# Patient Record
Sex: Female | Born: 1985 | Race: Black or African American | Hispanic: No | Marital: Single | Smoking: Never smoker
Health system: Southern US, Community
[De-identification: ages and names within clinical notes are randomized; demographics above are authoritative.]

## PROBLEM LIST (undated history)

## (undated) DIAGNOSIS — C801 Malignant (primary) neoplasm, unspecified: Secondary | ICD-10-CM

## (undated) DIAGNOSIS — E119 Type 2 diabetes mellitus without complications: Secondary | ICD-10-CM

## (undated) HISTORY — PX: BREAST REDUCTION SURGERY: SHX8

---
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
## 2006-06-01 ENCOUNTER — Emergency Department (HOSPITAL_COMMUNITY): Admission: EM | Admit: 2006-06-01 | Discharge: 2006-06-01 | Payer: Self-pay | Admitting: Emergency Medicine

## 2010-08-04 ENCOUNTER — Emergency Department (HOSPITAL_COMMUNITY)
Admission: EM | Admit: 2010-08-04 | Discharge: 2010-08-04 | Disposition: A | Discharge status: Home / Self Care | Payer: Medicaid Other | Attending: Emergency Medicine | Admitting: Emergency Medicine

## 2010-08-04 DIAGNOSIS — N898 Other specified noninflammatory disorders of vagina: Secondary | ICD-10-CM | POA: Insufficient documentation

## 2010-08-04 DIAGNOSIS — N72 Inflammatory disease of cervix uteri: Secondary | ICD-10-CM | POA: Insufficient documentation

## 2010-08-04 LAB — WET PREP, GENITAL
Trich, Wet Prep: NONE SEEN
Yeast Wet Prep HPF POC: NONE SEEN

## 2010-08-04 LAB — URINALYSIS, ROUTINE W REFLEX MICROSCOPIC
Glucose, UA: 100 mg/dL — AB
Ketones, ur: NEGATIVE mg/dL
Nitrite: NEGATIVE
Protein, ur: 30 mg/dL — AB
Specific Gravity, Urine: >1.03 — ABNORMAL HIGH (ref 1.005–1.030)
Urobilinogen, UA: 1 mg/dL (ref 0.0–1.0)
pH: 6 (ref 5.0–8.0)

## 2010-08-04 LAB — URINE MICROSCOPIC-ADD ON

## 2010-08-04 MED ORDER — CEFTRIAXONE SODIUM 250 MG IJ SOLR
250.0000 mg | Freq: Once | INTRAMUSCULAR | Status: AC
  Administered 2010-08-04: 250 mg via INTRAMUSCULAR
  Filled 2010-08-04: qty 250

## 2010-08-04 MED ORDER — DOXYCYCLINE HYCLATE 100 MG PO CAPS: 100.0000 mg | ORAL_CAPSULE | Freq: Two times a day (BID) | ORAL | Status: AC

## 2010-08-04 NOTE — ED Notes (Signed)
Patient with no complaints at this time. Respirations even and unlabored. Skin warm/dry. Discharge instructions reviewed with patient at this time. Patient given opportunity to voice concerns/ask questions. Patient discharged at this time and left Emergency Department with steady gait.   

## 2010-08-04 NOTE — ED Provider Notes (Signed)
History     Chief Complaint  Patient presents with  . Vaginal Bleeding   Patient is a 25 y.o. female presenting with vaginal bleeding. The history is provided by the patient. No language interpreter was used.  Vaginal Bleeding This is a new problem. The current episode started 12 to 24 hours ago. Episode frequency: spotting noted only with wiping with toilet tissue.  The problem has not changed since onset.Pertinent negatives include no chest pain and no abdominal pain. Associated symptoms comments: No associated vaginal fluid leakage, dysuria, vaginal discharge, fever/chills, N/V. She has had right low back pain radiating to the right upper leg that started this morning and is similar to pain present in her second pregnancy in the 3rd trimester.. The symptoms are aggravated by nothing. The symptoms are relieved by nothing. She has tried nothing for the symptoms.    Past Medical History  Diagnosis Date  . Asthma     Past Surgical History  Procedure Date  . Cesarean section     History reviewed. No pertinent family history.  History  Substance Use Topics  . Smoking status: Never Smoker   . Smokeless tobacco: Not on file  . Alcohol Use: No    OB History    Grav Para Term Preterm Abortions TAB SAB Ect Mult Living   3 1   1  1          Review of Systems  Cardiovascular: Negative for chest pain.  Gastrointestinal: Negative for abdominal pain.  Genitourinary: Positive for vaginal bleeding.  All other systems reviewed and are negative.  With the exceptions noted in the HPI  Physical Exam  BP 128/65  Pulse 108  Temp(Src) 98.2 F (36.8 C) (Oral)  Resp 18  Ht 5\' 2"  (1.575 m)  Wt 177 lb (80.287 kg)  BMI 32.37 kg/m2  SpO2 100%  LMP 04/29/2010  Physical Exam  Constitutional: She is oriented to person, place, and time. She appears well-developed and well-nourished.       Vitals normal except typical pregnancy related tachycardia.  HENT:  Head: Normocephalic and  atraumatic.  Right Ear: Hearing normal.  Left Ear: Tympanic membrane normal.  Mouth/Throat: Uvula is midline, oropharynx is clear and moist and mucous membranes are normal.  Eyes: Conjunctivae, EOM and lids are normal. Pupils are equal, round, and reactive to light.  Neck: Trachea normal and phonation normal. No mass and no thyromegaly present.  Cardiovascular: Regular rhythm, S1 normal and S2 normal.   No extrasystoles are present. Tachycardia present.   Pulmonary/Chest: Effort normal and breath sounds normal. She exhibits no tenderness.  Abdominal: Soft. Bowel sounds are normal. There is no hepatosplenomegaly. There is no tenderness. There is no CVA tenderness. No hernia. Hernia confirmed negative in the right inguinal area and confirmed negative in the left inguinal area.    Genitourinary: Pelvic exam was performed with patient supine. There is no rash on the right labia. There is no rash on the left labia. There is bleeding around the vagina. Vaginal discharge found.       Bleeding is scant with a single small clot. There is a mucoid, greenish d/c per the cervical os. There is no cervical motion tenderness, adnexal or uterine tenderness or adnexal mass on bimanual palpation.  Musculoskeletal: Normal range of motion.       Cervical back: Normal.       Thoracic back: Normal.       Lumbar back: Normal.       No paraspinal tenderness  Neurological: She is alert and oriented to person, place, and time. She has normal strength. No cranial nerve deficit or sensory deficit.  Skin: Skin is warm, dry and intact.  Psychiatric: She has a normal mood and affect. Her speech is normal and behavior is normal. Judgment and thought content normal. Cognition and memory are normal.    ED Course  Procedures  MDM Initial impression is cervicitis; doubt pre-term labor, placenta previa or abruption      Flint Melter, MD 08/04/10 1301

## 2010-08-04 NOTE — ED Notes (Signed)
Pt presents with spotting, back pain, and right leg numbness. Pt [redacted] wks pregnant. Pt seen by Dr Shelva Majestic for [place]. LMP was 04/29/2010. [facility] 01/19/2011

## 2010-08-04 NOTE — ED Notes (Signed)
Lab notified of urine culture 

## 2010-08-05 LAB — URINE CULTURE
Colony Count: 60000
Culture  Setup Time: 201207152052

## 2010-08-06 LAB — GC/CHLAMYDIA PROBE AMP, GENITAL
Chlamydia, DNA Probe: NEGATIVE
GC Probe Amp, Genital: NEGATIVE

## 2011-08-09 ENCOUNTER — Emergency Department (HOSPITAL_COMMUNITY)
Admission: EM | Admit: 2011-08-09 | Discharge: 2011-08-09 | Disposition: A | Discharge status: Home / Self Care | Payer: Self-pay | Attending: Emergency Medicine | Admitting: Emergency Medicine

## 2011-08-09 ENCOUNTER — Encounter (HOSPITAL_COMMUNITY): Payer: Self-pay | Admitting: *Deleted

## 2011-08-09 DIAGNOSIS — K029 Dental caries, unspecified: Secondary | ICD-10-CM | POA: Insufficient documentation

## 2011-08-09 DIAGNOSIS — K0889 Other specified disorders of teeth and supporting structures: Secondary | ICD-10-CM

## 2011-08-09 MED ORDER — PENICILLIN V POTASSIUM 250 MG PO TABS: 250.0000 mg | ORAL_TABLET | Freq: Four times a day (QID) | ORAL | Status: AC

## 2011-08-09 MED ORDER — HYDROCODONE-ACETAMINOPHEN 5-325 MG PO TABS: ORAL_TABLET | ORAL | Status: AC

## 2011-08-09 MED ORDER — NAPROXEN 250 MG PO TABS: 250.0000 mg | ORAL_TABLET | Freq: Two times a day (BID) | ORAL | Status: AC

## 2011-08-09 NOTE — ED Notes (Signed)
Pt c/o left toothache, earache and headache x 2 weeks.

## 2011-08-09 NOTE — ED Provider Notes (Signed)
History     CSN: [id number]  Arrival date & time 08/09/11  1114   First MD Initiated Contact with Patient 08/09/11 1120      Chief Complaint  Patient presents with  . Dental Pain    HPI Pt was seen at 1120.  Per pt, c/o gradual onset and persistence of constant left upper teeth "pain" for the past 2 weeks.  States the pain has now caused her left ear and left side of her head to "hurt."  Denies fevers, no intra-oral edema, no rash, no facial swelling, no dysphagia, no neck pain.   The condition is aggravated by nothing. The condition is relieved by nothing. The patient has no significant history of serious medical conditions.    Pt on depo shot Past Medical History  Diagnosis Date  . Asthma     Past Surgical History  Procedure Date  . Cesarean section    History  Substance Use Topics  . Smoking status: Never Smoker   . Smokeless tobacco: Not on file  . Alcohol Use: No    OB History    Grav Para Term Preterm Abortions TAB SAB Ect Mult Living   3 1   1  1          Review of Systems ROS: Statement: All systems negative except as marked or noted in the HPI; Constitutional: Negative for fever and chills. ; ; Eyes: Negative for eye pain and discharge. ; ; ENMT: Positive for dental caries, dental hygiene poor and toothache. Negative for bleeding gums, dental injury, facial deformity, facial swelling, hoarseness, nasal congestion, sinus pressure, sore throat, throat swelling and tongue swollen. ; ; Cardiovascular: Negative for chest pain, palpitations, diaphoresis, dyspnea and peripheral edema. ; ; Respiratory: Negative for cough, wheezing and stridor. ; ; Gastrointestinal: Negative for nausea, vomiting, diarrhea and abdominal pain. ; ; Genitourinary: Negative for dysuria, flank pain and hematuria. ; ; Musculoskeletal: Negative for back pain and neck pain. ; ; Skin: Negative for rash and skin lesion. ; ; Neuro: Negative for headache, lightheadedness and neck stiffness. ;     Allergies  Review of patient's allergies indicates no known allergies.  Home Medications   Current Outpatient Rx  Name Route Sig Dispense Refill  . FERROUS SULFATE 325 (65 FE) MG PO TABS Oral Take 325 mg by mouth daily with breakfast.      . HYDROCODONE-ACETAMINOPHEN 5-325 MG PO TABS  1 or 2 tabs PO q4-6 hours prn pain 20 tablet 0  . NAPROXEN 250 MG PO TABS Oral Take 1 tablet (250 mg total) by mouth 2 (two) times daily with a meal. 14 tablet 0  . PENICILLIN V POTASSIUM 250 MG PO TABS Oral Take 1 tablet (250 mg total) by mouth 4 (four) times daily. 20 tablet 0  . PRENATAL RX 60-1 MG PO TABS Oral Take 1 tablet by mouth daily.        BP 135/80  Pulse 92  Temp 98.4 F (36.9 C) (Oral)  Resp 16  Ht 5\' 2"  (1.575 m)  Wt 175 lb (79.379 kg)  BMI 32.01 kg/m2  SpO2 100%  LMP 04/29/2010  Breastfeeding? Unknown  Physical Exam 1125: Physical examination: Vital signs and O2 SAT: Reviewed; Constitutional: Well developed, Well nourished, Well hydrated, In no acute distress; Head and Face: Normocephalic, Atraumatic; Eyes: EOMI, PERRL, No scleral icterus; ENMT: Mouth and pharynx normal, Poor dentition, Widespread dental decay, Left TM normal, Right TM normal, Mucous membranes moist, +upper left 1st and 2nd premolars with  extensive dental decay.  No gingival erythema, edema, fluctuance, or drainage.  No hoarse voice, no drooling, no stridor.  ; Neck: Supple, Full range of motion, No lymphadenopathy; Cardiovascular: Regular rate and rhythm, No murmur, rub, or gallop; Respiratory: Breath sounds clear & equal bilaterally, No rales, rhonchi, wheezes, or rub, Normal respiratory effort/excursion; Chest: Nontender, Movement normal; Extremities: Pulses normal, No tenderness, No edema; Neuro: AA&Ox3, Major CN grossly intact.  No gross focal motor or sensory deficits in extremities.; Skin: Color normal, No rash, No petechiae, Warm, Dry   ED Course  Procedures     MDM  MDM Reviewed: nursing note and  vitals     11:26 AM:  Pt encouraged to f/u with dentist or oral surgeon for her dental needs for good continuity of care and definitive treatment.  Verb understanding.          Laray Anger, DO 08/11/11 1630

## 2012-10-07 ENCOUNTER — Other Ambulatory Visit: Payer: Self-pay

## 2012-10-26 ENCOUNTER — Ambulatory Visit (HOSPITAL_COMMUNITY)
Admission: RE | Admit: 2012-10-26 | Discharge: 2012-10-26 | Disposition: A | Discharge status: Home / Self Care | Payer: Medicaid Other | Source: Ambulatory Visit | Attending: Obstetrics and Gynecology | Admitting: Obstetrics and Gynecology

## 2012-10-26 ENCOUNTER — Other Ambulatory Visit (HOSPITAL_COMMUNITY): Payer: Self-pay | Admitting: Obstetrics and Gynecology

## 2012-10-26 ENCOUNTER — Encounter (HOSPITAL_COMMUNITY): Payer: Self-pay

## 2012-10-26 VITALS — BP 113/69 | HR 85 | Wt 186.5 lb

## 2012-10-26 DIAGNOSIS — O09299 Supervision of pregnancy with other poor reproductive or obstetric history, unspecified trimester: Secondary | ICD-10-CM

## 2012-10-26 DIAGNOSIS — O2620 Pregnancy care for patient with recurrent pregnancy loss, unspecified trimester: Secondary | ICD-10-CM | POA: Insufficient documentation

## 2012-10-26 DIAGNOSIS — O34219 Maternal care for unspecified type scar from previous cesarean delivery: Secondary | ICD-10-CM | POA: Insufficient documentation

## 2012-10-26 DIAGNOSIS — O2622 Pregnancy care for patient with recurrent pregnancy loss, second trimester: Secondary | ICD-10-CM

## 2012-10-26 DIAGNOSIS — O3430 Maternal care for cervical incompetence, unspecified trimester: Secondary | ICD-10-CM | POA: Insufficient documentation

## 2012-10-26 MED ORDER — HYDROXYPROGESTERONE CAPROATE 250 MG/ML IM OIL
250.0000 mg | TOPICAL_OIL | Freq: Once | INTRAMUSCULAR | Status: AC
  Administered 2012-10-26: 250 mg via INTRAMUSCULAR
  Filled 2012-10-26: qty 1

## 2012-10-27 LAB — LUPUS ANTICOAGULANT PANEL
DRVVT: 33.8 secs (ref <42.9)
Lupus Anticoagulant: NOT DETECTED
PTT Lupus Anticoagulant: 32.2 secs (ref 28.0–43.0)

## 2012-10-27 LAB — BETA-2-GLYCOPROTEIN I ABS, IGG/M/A
Beta-2 Glyco I IgG: 1 G Units (ref <20)
Beta-2-Glycoprotein I IgA: 0 A Units (ref <20)
Beta-2-Glycoprotein I IgM: 0 M Units (ref <20)

## 2012-10-27 LAB — CARDIOLIPIN ANTIBODIES, IGM+IGG
Anticardiolipin IgG: 8 GPL U/mL — ABNORMAL LOW (ref <23)
Anticardiolipin IgM: 0 MPL U/mL — ABNORMAL LOW (ref <11)

## 2012-10-29 ENCOUNTER — Telehealth (HOSPITAL_COMMUNITY): Payer: Self-pay | Admitting: *Deleted

## 2012-10-29 NOTE — Telephone Encounter (Signed)
Called patient with results of lupus anticoag panel, anticardiolipin, and beta2 glycoprotein.  Notified these are WNL.  Pt verbalized understanding.  Reminded patient of returning for P17 injection next Tuesday.

## 2012-11-02 ENCOUNTER — Encounter (HOSPITAL_COMMUNITY): Payer: Medicaid Other

## 2012-11-02 ENCOUNTER — Telehealth (HOSPITAL_COMMUNITY): Payer: Self-pay | Admitting: *Deleted

## 2012-11-02 ENCOUNTER — Ambulatory Visit (HOSPITAL_COMMUNITY)
Admission: RE | Admit: 2012-11-02 | Discharge: 2012-11-02 | Disposition: A | Discharge status: Home / Self Care | Payer: Medicaid Other | Source: Ambulatory Visit | Attending: Obstetrics and Gynecology | Admitting: Obstetrics and Gynecology

## 2012-11-02 MED ORDER — HYDROXYPROGESTERONE CAPROATE 250 MG/ML IM OIL
250.0000 mg | TOPICAL_OIL | Freq: Once | INTRAMUSCULAR | Status: DC
  Filled 2012-11-02: qty 1

## 2013-01-28 ENCOUNTER — Observation Stay: Payer: Self-pay | Admitting: Obstetrics and Gynecology

## 2013-01-28 LAB — URINALYSIS, COMPLETE
Bilirubin,UR: NEGATIVE
Blood: NEGATIVE
Glucose,UR: >=500 mg/dL (ref 0–75)
Ketone: NEGATIVE
Nitrite: NEGATIVE
Ph: 6 (ref 4.5–8.0)
Protein: NEGATIVE
RBC,UR: 4 /HPF (ref 0–5)
Specific Gravity: 1.017 (ref 1.003–1.030)
Squamous Epithelial: 12
WBC UR: 11 /HPF (ref 0–5)

## 2013-02-11 ENCOUNTER — Other Ambulatory Visit (HOSPITAL_COMMUNITY): Payer: Self-pay | Admitting: Obstetrics and Gynecology

## 2013-02-11 DIAGNOSIS — O2620 Pregnancy care for patient with recurrent pregnancy loss, unspecified trimester: Secondary | ICD-10-CM

## 2013-02-16 ENCOUNTER — Ambulatory Visit (HOSPITAL_COMMUNITY)
Admission: RE | Admit: 2013-02-16 | Discharge: 2013-02-16 | Disposition: A | Discharge status: Home / Self Care | Payer: Medicaid Other | Source: Ambulatory Visit | Attending: Obstetrics and Gynecology | Admitting: Obstetrics and Gynecology

## 2013-02-16 ENCOUNTER — Other Ambulatory Visit (HOSPITAL_COMMUNITY): Payer: Self-pay | Admitting: Obstetrics and Gynecology

## 2013-02-16 DIAGNOSIS — O2620 Pregnancy care for patient with recurrent pregnancy loss, unspecified trimester: Secondary | ICD-10-CM

## 2013-02-16 DIAGNOSIS — O34219 Maternal care for unspecified type scar from previous cesarean delivery: Secondary | ICD-10-CM | POA: Insufficient documentation

## 2013-07-12 ENCOUNTER — Encounter (HOSPITAL_COMMUNITY): Payer: Self-pay

## 2013-08-31 ENCOUNTER — Encounter (HOSPITAL_COMMUNITY): Payer: Self-pay | Admitting: *Deleted

## 2013-11-21 ENCOUNTER — Encounter (HOSPITAL_COMMUNITY): Payer: Self-pay | Admitting: *Deleted

## 2015-11-24 ENCOUNTER — Emergency Department
Admission: EM | Admit: 2015-11-24 | Discharge: 2015-11-24 | Disposition: A | Discharge status: Home / Self Care | Payer: Medicaid Other | Attending: Emergency Medicine | Admitting: Emergency Medicine

## 2015-11-24 ENCOUNTER — Encounter: Payer: Self-pay | Admitting: Emergency Medicine

## 2015-11-24 DIAGNOSIS — Z79899 Other long term (current) drug therapy: Secondary | ICD-10-CM | POA: Diagnosis not present

## 2015-11-24 DIAGNOSIS — N390 Urinary tract infection, site not specified: Secondary | ICD-10-CM

## 2015-11-24 DIAGNOSIS — R42 Dizziness and giddiness: Secondary | ICD-10-CM

## 2015-11-24 DIAGNOSIS — O99282 Endocrine, nutritional and metabolic diseases complicating pregnancy, second trimester: Secondary | ICD-10-CM | POA: Diagnosis not present

## 2015-11-24 DIAGNOSIS — O26892 Other specified pregnancy related conditions, second trimester: Secondary | ICD-10-CM | POA: Diagnosis present

## 2015-11-24 DIAGNOSIS — J45909 Unspecified asthma, uncomplicated: Secondary | ICD-10-CM | POA: Insufficient documentation

## 2015-11-24 DIAGNOSIS — E860 Dehydration: Secondary | ICD-10-CM | POA: Insufficient documentation

## 2015-11-24 DIAGNOSIS — Z3A17 17 weeks gestation of pregnancy: Secondary | ICD-10-CM | POA: Insufficient documentation

## 2015-11-24 DIAGNOSIS — O2342 Unspecified infection of urinary tract in pregnancy, second trimester: Secondary | ICD-10-CM | POA: Insufficient documentation

## 2015-11-24 LAB — CBC WITH DIFFERENTIAL/PLATELET
Basophils Absolute: 0 10*3/uL (ref 0–0.1)
Basophils Relative: 0 %
Eosinophils Absolute: 0.1 10*3/uL (ref 0–0.7)
Eosinophils Relative: 2 %
HCT: 35.9 % (ref 35.0–47.0)
Hemoglobin: 12.2 g/dL (ref 12.0–16.0)
Lymphocytes Relative: 26 %
Lymphs Abs: 2.3 10*3/uL (ref 1.0–3.6)
MCH: 31.6 pg (ref 26.0–34.0)
MCHC: 34 g/dL (ref 32.0–36.0)
MCV: 93 fL (ref 80.0–100.0)
Monocytes Absolute: 0.6 10*3/uL (ref 0.2–0.9)
Monocytes Relative: 7 %
Neutro Abs: 5.6 10*3/uL (ref 1.4–6.5)
Neutrophils Relative %: 65 %
Platelets: 279 10*3/uL (ref 150–440)
RBC: 3.86 MIL/uL (ref 3.80–5.20)
RDW: 14 % (ref 11.5–14.5)
WBC: 8.7 10*3/uL (ref 3.6–11.0)

## 2015-11-24 LAB — URINALYSIS COMPLETE WITH MICROSCOPIC (ARMC ONLY)
Bilirubin Urine: NEGATIVE
Glucose, UA: NEGATIVE mg/dL
Hgb urine dipstick: NEGATIVE
Ketones, ur: NEGATIVE mg/dL
Nitrite: NEGATIVE
Protein, ur: NEGATIVE mg/dL
Specific Gravity, Urine: 1.019 (ref 1.005–1.030)
pH: 6 (ref 5.0–8.0)

## 2015-11-24 LAB — BASIC METABOLIC PANEL
Anion gap: 7 (ref 5–15)
BUN: 6 mg/dL (ref 6–20)
CO2: 21 mmol/L — ABNORMAL LOW (ref 22–32)
Calcium: 8.8 mg/dL — ABNORMAL LOW (ref 8.9–10.3)
Chloride: 106 mmol/L (ref 101–111)
Creatinine, Ser: 0.46 mg/dL (ref 0.44–1.00)
GFR calc Af Amer: >60 mL/min (ref >60)
GFR calc non Af Amer: >60 mL/min (ref >60)
Glucose, Bld: 105 mg/dL — ABNORMAL HIGH (ref 65–99)
Potassium: 3.3 mmol/L — ABNORMAL LOW (ref 3.5–5.1)
Sodium: 134 mmol/L — ABNORMAL LOW (ref 135–145)

## 2015-11-24 LAB — HCG, QUANTITATIVE, PREGNANCY: hCG, Beta Chain, Quant, S: 40535 m[IU]/mL — ABNORMAL HIGH (ref <5)

## 2015-11-24 LAB — TROPONIN I: Troponin I: <0.03 ng/mL (ref <0.03)

## 2015-11-24 MED ORDER — NITROFURANTOIN MONOHYD MACRO 100 MG PO CAPS: 100.0000 mg | ORAL_CAPSULE | Freq: Two times a day (BID) | ORAL | 0 refills | Status: AC

## 2015-11-24 MED ORDER — SODIUM CHLORIDE 0.9 % IV BOLUS (SEPSIS)
500.0000 mL | Freq: Once | INTRAVENOUS | Status: AC
  Administered 2015-11-24: 500 mL via INTRAVENOUS

## 2015-11-24 NOTE — ED Notes (Signed)
Pt ambulated hallway w/o difficulty, denies dizziness. MD made aware.

## 2015-11-24 NOTE — ED Provider Notes (Signed)
[facility] Emergency Department Provider Note   ____________________________________________   I have reviewed the triage vital signs and the nursing notes.   HISTORY  Chief Complaint Dizziness   History limited by: Not Limited   HPI Amy Nelson is a 30 y.o. female at [redacted] weeks pregnant who presents to the emergency department today because of concern for an episode of fainting last week and dizziness today. 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 The patient states that the episode of syncope was very brief. She did not have any chest pain or palpitations with either episode. The patient denies any recent illness or fevers.   Past Medical History:  Diagnosis Date  . Asthma     Patient Active Problem List   Diagnosis Date Noted  . Pregnancy complicated by previous recurrent miscarriages 10/26/2012    Past Surgical History:  Procedure Laterality Date  . CESAREAN SECTION      Prior to Admission medications   Medication Sig Start Date End Date Taking? Authorizing Provider  ferrous sulfate 325 (65 FE) MG tablet Take 325 mg by mouth daily with breakfast.      Historical Provider, MD  Prenatal Vit-Fe Fumarate-FA (PRENATAL MULTIVITAMIN) 60-1 MG tablet Take 1 tablet by mouth daily.      Historical Provider, MD    Allergies Review of patient's allergies indicates no known allergies.  No family history on file.  Social History Social History  Substance Use Topics  . Smoking status: Never Smoker  . Smokeless tobacco: Not on file  . Alcohol use No    Review of Systems  Constitutional: Negative for fever. Cardiovascular: Negative for chest pain. Respiratory: Negative for shortness of breath. Gastrointestinal: Negative for abdominal pain, vomiting and diarrhea. Neurological: Negative for headaches, focal weakness or numbness.  10-point ROS otherwise  negative.  ____________________________________________   PHYSICAL EXAM:  VITAL SIGNS: ED Triage Vitals [11/24/15 0745]  Enc Vitals Group     BP 136/89     Pulse Rate 98     Resp 18     Temp 98.4 F (36.9 C)     Temp Source Oral     SpO2 99 %     Weight 184 lb (83.5 kg)     Height 5\' 2"  (1.575 m)   Constitutional: Alert and oriented. Well appearing and in no distress. Eyes: Conjunctivae are normal. Normal extraocular movements. ENT   Head: Normocephalic and atraumatic.   Nose: No congestion/rhinnorhea.   Mouth/Throat: Mucous membranes are moist.   Neck: No stridor. Hematological/Lymphatic/Immunilogical: No cervical lymphadenopathy. Cardiovascular: Normal rate, regular rhythm.  No murmurs, rubs, or gallops.  Respiratory: Normal respiratory effort without tachypnea nor retractions. Breath sounds are clear and equal bilaterally. No wheezes/rales/rhonchi. Gastrointestinal: Soft and nontender. No distention.  Genitourinary: Deferred Musculoskeletal: Normal range of motion in all extremities. No lower extremity edema. Neurologic:  Normal speech and language. No gross focal neurologic deficits are appreciated.  Skin:  Skin is warm, dry and intact. No rash noted. Psychiatric: Mood and affect are normal. Speech and behavior are normal. Patient exhibits appropriate insight and judgment.  ____________________________________________    LABS (pertinent positives/negatives)  Labs Reviewed  BASIC METABOLIC PANEL - Abnormal; Notable for the following:       Result Value   Sodium 134 (*)    Potassium 3.3 (*)    CO2 21 (*)    Glucose, Bld 105 (*)  Calcium 8.8 (*)    All other components within normal limits  URINALYSIS COMPLETEWITH MICROSCOPIC ([facility] ONLY) - Abnormal; Notable for the following:    Color, Urine YELLOW (*)    APPearance HAZY (*)    Leukocytes, UA 1+ (*)    Bacteria, UA RARE (*)    Squamous Epithelial / LPF 6-30 (*)    All other components within  normal limits  HCG, QUANTITATIVE, PREGNANCY - Abnormal; Notable for the following:    hCG, Beta Chain, Quant, S 40,535 (*)    All other components within normal limits  CBC WITH DIFFERENTIAL/PLATELET  TROPONIN I     ____________________________________________   EKG  I, Nance Pear, attending physician, personally viewed and interpreted this EKG  EKG Time: 0755 Rate: 84 Rhythm: normal sinus rhythm Axis: normal Intervals: qtc 445 QRS: narrow ST changes: no st elevation Impression: normal ekg  ____________________________________________    RADIOLOGY  None  ____________________________________________   PROCEDURES  Procedures  ____________________________________________   INITIAL IMPRESSION / ASSESSMENT AND PLAN / ED COURSE  Pertinent labs & imaging results that were available during my care of the patient were reviewed by me and considered in my medical decision making (see chart for details).  Patient's blood work without concerning anemia. Patients ua concerning for some WBCs and patient states she has been having some abdominal pain. At this point think likely today's episode was near syncopal likely secondary to pregnancy. No chest pain or SOB to suggest PE.   ____________________________________________   FINAL CLINICAL IMPRESSION(S) / ED DIAGNOSES  Final diagnoses:  Dizziness  Dehydration  Urinary tract infection without hematuria, site unspecified     Note: This dictation was prepared with Dragon dictation. Any transcriptional errors that result from this process are unintentional    Nance Pear, MD 11/24/15 1135

## 2015-11-24 NOTE — Discharge Instructions (Signed)
Please seek medical attention for any high fevers, chest pain, shortness of breath, change in behavior, persistent vomiting, bloody stool or any other new or concerning symptoms.  

## 2015-11-24 NOTE — ED Triage Notes (Signed)
Pt states that she is [redacted] weeks pregnant, states that earlier in the week she passed out after becoming dizzy while working, pt states that it happened again this am while working too. No distress noted at this time, pt states that she drove herself and is here alone. Pt states "this dizzy spell happened at 0600 today"

## 2018-02-03 DIAGNOSIS — M5440 Lumbago with sciatica, unspecified side: Secondary | ICD-10-CM | POA: Diagnosis not present

## 2018-02-03 DIAGNOSIS — G8929 Other chronic pain: Secondary | ICD-10-CM | POA: Diagnosis not present

## 2018-02-03 DIAGNOSIS — N62 Hypertrophy of breast: Secondary | ICD-10-CM | POA: Diagnosis not present

## 2018-02-03 DIAGNOSIS — M542 Cervicalgia: Secondary | ICD-10-CM | POA: Diagnosis not present

## 2018-03-31 DIAGNOSIS — N62 Hypertrophy of breast: Secondary | ICD-10-CM | POA: Diagnosis not present

## 2018-05-26 DIAGNOSIS — Z01818 Encounter for other preprocedural examination: Secondary | ICD-10-CM | POA: Diagnosis not present

## 2018-05-26 DIAGNOSIS — N62 Hypertrophy of breast: Secondary | ICD-10-CM | POA: Diagnosis not present

## 2018-05-26 DIAGNOSIS — Z1159 Encounter for screening for other viral diseases: Secondary | ICD-10-CM | POA: Diagnosis not present

## 2018-05-28 DIAGNOSIS — M5442 Lumbago with sciatica, left side: Secondary | ICD-10-CM | POA: Diagnosis not present

## 2018-05-28 DIAGNOSIS — M542 Cervicalgia: Secondary | ICD-10-CM | POA: Diagnosis not present

## 2018-05-28 DIAGNOSIS — M5441 Lumbago with sciatica, right side: Secondary | ICD-10-CM | POA: Diagnosis not present

## 2018-05-28 DIAGNOSIS — G8929 Other chronic pain: Secondary | ICD-10-CM | POA: Diagnosis not present

## 2018-05-28 DIAGNOSIS — M5440 Lumbago with sciatica, unspecified side: Secondary | ICD-10-CM | POA: Diagnosis not present

## 2018-05-28 DIAGNOSIS — G8918 Other acute postprocedural pain: Secondary | ICD-10-CM | POA: Diagnosis not present

## 2018-05-28 DIAGNOSIS — N62 Hypertrophy of breast: Secondary | ICD-10-CM | POA: Diagnosis not present

## 2018-05-29 DIAGNOSIS — G8929 Other chronic pain: Secondary | ICD-10-CM | POA: Diagnosis not present

## 2018-05-29 DIAGNOSIS — M5441 Lumbago with sciatica, right side: Secondary | ICD-10-CM | POA: Diagnosis not present

## 2018-05-29 DIAGNOSIS — M5442 Lumbago with sciatica, left side: Secondary | ICD-10-CM | POA: Diagnosis not present

## 2018-05-29 DIAGNOSIS — M542 Cervicalgia: Secondary | ICD-10-CM | POA: Diagnosis not present

## 2018-05-29 DIAGNOSIS — N62 Hypertrophy of breast: Secondary | ICD-10-CM | POA: Diagnosis not present

## 2018-06-02 DIAGNOSIS — N62 Hypertrophy of breast: Secondary | ICD-10-CM | POA: Diagnosis not present

## 2018-06-09 DIAGNOSIS — N62 Hypertrophy of breast: Secondary | ICD-10-CM | POA: Diagnosis not present

## 2018-08-09 LAB — VITAMIN D 25 HYDROXY (VIT D DEFICIENCY, FRACTURES): Vit D, 25-Hydroxy: 25

## 2018-08-09 LAB — TSH: TSH: 2.27 (ref 0.41–5.90)

## 2018-08-09 LAB — HEMOGLOBIN A1C: Hemoglobin A1C: 5.2

## 2018-09-28 DIAGNOSIS — Z3482 Encounter for supervision of other normal pregnancy, second trimester: Secondary | ICD-10-CM | POA: Diagnosis not present

## 2018-09-29 ENCOUNTER — Other Ambulatory Visit: Payer: Self-pay

## 2018-09-29 ENCOUNTER — Ambulatory Visit (INDEPENDENT_AMBULATORY_CARE_PROVIDER_SITE_OTHER): Payer: BC Managed Care – PPO | Admitting: "Endocrinology

## 2018-09-29 ENCOUNTER — Encounter: Payer: Self-pay | Admitting: "Endocrinology

## 2018-09-29 VITALS — BP 117/83 | HR 120 | Ht 62.0 in | Wt 188.0 lb

## 2018-09-29 DIAGNOSIS — E049 Nontoxic goiter, unspecified: Secondary | ICD-10-CM | POA: Diagnosis not present

## 2018-09-29 NOTE — Progress Notes (Signed)
Endocrinology Consult Note                                            09/29/2018, 5:20 PM   Subjective:    Patient ID: Amy Nelson, female    DOB: Feb 10, 1985, PCP [facility]   Past Medical History:  Diagnosis Date  . Asthma    Past Surgical History:  Procedure Laterality Date  . BREAST REDUCTION SURGERY    . CESAREAN SECTION     Social History   Socioeconomic History  . Marital status: Single    Spouse name: Not on file  . Number of children: Not on file  . Years of education: Not on file  . Highest education level: Not on file  Occupational History  . Not on file  Social Needs  . Financial resource strain: Not on file  . Food insecurity    Worry: Not on file    Inability: Not on file  . Transportation needs    Medical: Not on file    Non-medical: Not on file  Tobacco Use  . Smoking status: Never Smoker  . Smokeless tobacco: Never Used  Substance and Sexual Activity  . Alcohol use: No  . Drug use: No  . Sexual activity: Yes    Birth control/protection: None  Lifestyle  . Physical activity    Days per week: Not on file    Minutes per session: Not on file  . Stress: Not on file  Relationships  . Social Herbalist on phone: Not on file    Gets together: Not on file    Attends religious service: Not on file    Active member of club or organization: Not on file    Attends meetings of clubs or organizations: Not on file    Relationship status: Not on file  Other Topics Concern  . Not on file  Social History Narrative  . Not on file   Family History  Problem Relation Age of Onset  . Diabetes Mother   . Hypertension Mother   . Hypertension Father   . Hyperlipidemia Father    Outpatient Encounter Medications as of 09/29/2018  Medication Sig  . Prenatal Vit-Fe Fumarate-FA (PRENATAL MULTIVITAMIN) 60-1 MG tablet Take 1 tablet by mouth daily.     No facility-administered encounter medications on file as  of 09/29/2018.    ALLERGIES: No Known Allergies  VACCINATION STATUS:  There is no immunization history on file for this patient.  HPI Amy Nelson is 33 y.o. female who presents today with a medical history as above. she is being seen in consultation for nodular goiter requested by Health, [facility]. She has noticed goiter for several years, recently increasing in size.  She had recent normal thyroid function test.  Her ultrasound on September 10, 2018 showed 7.7 cm nodule on the left lobe which measures 8 point is admitted reportedly increasing in size compared to another study in February 2018.  -She is currently pregnant at [redacted] weeks gestation.  She denies dysphagia, shortness of breath, nor voice change.  She has gained weight, appropriate to her pregnancy. She denies any family history of thyroid malignancy, however her mother has hypothyroidism on thyroid hormone supplement. She denies exposure to neck radiation.  She is not currently on  thyroid hormone supplement nor antithyroid medications.     Review of Systems  Constitutional: + Pregnancy related weight gain, no fatigue, no subjective hyperthermia, no subjective hypothermia Eyes: no blurry vision, no xerophthalmia ENT: no sore throat, + goiter, no dysphagia/odynophagia, no hoarseness Cardiovascular: no Chest Pain, no Shortness of Breath, no palpitations, no leg swelling Respiratory: no cough, no shortness of breath Gastrointestinal: no Nausea/Vomiting/Diarhhea Musculoskeletal: no muscle/joint aches Skin: no rashes Neurological: no tremors, no numbness, no tingling, no dizziness Psychiatric: no depression, no anxiety  Objective:    BP 117/83   Pulse (!) 120   Ht 5\' 2"  (1.575 m)   Wt 188 lb (85.3 kg)   BMI 34.39 kg/m   Wt Readings from Last 3 Encounters:  09/29/18 188 lb (85.3 kg)  11/24/15 184 lb (83.5 kg)  02/16/13 192 lb (87.1 kg)    Physical Exam  Constitutional:  Body mass index is  34.39 kg/m.,  not in acute distress, normal state of mind Eyes: PERRLA, EOMI, no exophthalmos ENT: moist mucous membranes, + large goiter asymmetric L>R, no gross cervical lymphadenopathy Cardiovascular: normal precordial activity, Regular Rate and Rhythm, no Murmur/Rubs/Gallops Respiratory:  adequate breathing efforts, no gross chest deformity, Clear to auscultation bilaterally Gastrointestinal: abdomen soft, Non -tender, No distension, Bowel Sounds present, no gross organomegaly Musculoskeletal: no gross deformities, strength intact in all four extremities Skin: moist, warm, no rashes Neurological: no tremor with outstretched hands, Deep tendon reflexes normal in bilateral lower extremities.  CMP ( most recent) CMP     Component Value Date/Time   NA 134 (L) 11/24/2015 0806   K 3.3 (L) 11/24/2015 0806   CL 106 11/24/2015 0806   CO2 21 (L) 11/24/2015 0806   GLUCOSE 105 (H) 11/24/2015 0806   BUN 6 11/24/2015 0806   CREATININE 0.46 11/24/2015 0806   CALCIUM 8.8 (L) 11/24/2015 0806   GFRNONAA >60 11/24/2015 0806   GFRAA >60 11/24/2015 0806     Summary of her thyroid ultrasound from September 10, 2018: Right lobe 5.1 cm x 1.2 cm x 1.3 cm-no nodules. Left lobe 8.5 mm x 5.3 cm x 4.4 cm with 7.7 cm nodule observed to increase in size compared to prior study from February 2018.  Recent Results (from the past 2160 hour(s))  VITAMIN D 25 Hydroxy (Vit-D Deficiency, Fractures)     Status: None   Collection Time: 08/09/18 12:00 AM  Result Value Ref Range   Vit D, 25-Hydroxy 25   Hemoglobin A1c     Status: None   Collection Time: 08/09/18 12:00 AM  Result Value Ref Range   Hemoglobin A1C 5.2   TSH     Status: None   Collection Time: 08/09/18 12:00 AM  Result Value Ref Range   TSH 2.27 0.41 - 5.90    Comment: Total T4 8.8, TPO antibodies < 9, tg abs <1     Assessment & Plan:   1. Nodular goiter 2.  Pregnant-16 weeks  - Timothy D Fulop  is being seen at a kind request of Health,  [place]. - I have reviewed her available thyroid records and clinically evaluated the patient. - Based on these reviews, she has euthyroid nodular goiter,  however,  there is not sufficient information to proceed with definitive treatment plan.  - she will need ultrasound-guided fine-needle aspiration at mid second trimester of her pregnancy (4 weeks from now) and will return for a visit.  If she is found to have suspicious or frank malignancy, she  will be considered for thyroidectomy during her second trimester.   In her FNA cytology is negative for malignancy, she will be observed with periodic thyroid ultrasound.  She does not have compressive symptoms at this time.   Based on her recent thyroid function test, she does not need intervention with thyroid hormone supplement nor antithyroid medication.  - I did not initiate any new prescriptions today. - I advised her  to maintain close follow up with [facility], [facility] for primary care needs.   - Time spent with the patient: 45 minutes, of which >50% was spent in obtaining information about her symptoms, reviewing her previous labs/studies,  evaluations, and treatments, counseling her about her nodular goiter, and developing a plan to confirm the diagnosis and long term treatment based on the latest standards of care/guidelines.    Amy Nelson participated in the discussions, expressed understanding, and voiced agreement with the above plans.  All questions were answered to her satisfaction. she is encouraged to contact clinic should she have any questions or concerns prior to her return visit.  Follow up plan: Return in about 6 weeks (around 11/10/2018) for Follow up with Biopsy Results.   Glade Lloyd, MD [facility] [address] [place], [place] [address] Phone: [phone]  Fax: [phone]     09/29/2018, 5:20 PM  This  note was partially dictated with voice recognition software. Similar sounding words can be transcribed inadequately or may not  be corrected upon review.

## 2018-10-12 DIAGNOSIS — O351XX0 Maternal care for (suspected) chromosomal abnormality in fetus, not applicable or unspecified: Secondary | ICD-10-CM | POA: Diagnosis not present

## 2018-10-12 DIAGNOSIS — Z3A17 17 weeks gestation of pregnancy: Secondary | ICD-10-CM | POA: Diagnosis not present

## 2018-10-12 DIAGNOSIS — Z3A18 18 weeks gestation of pregnancy: Secondary | ICD-10-CM | POA: Diagnosis not present

## 2018-10-12 DIAGNOSIS — Z315 Encounter for genetic counseling: Secondary | ICD-10-CM | POA: Diagnosis not present

## 2018-10-12 DIAGNOSIS — O288 Other abnormal findings on antenatal screening of mother: Secondary | ICD-10-CM | POA: Diagnosis not present

## 2018-10-12 DIAGNOSIS — Z3A00 Weeks of gestation of pregnancy not specified: Secondary | ICD-10-CM | POA: Diagnosis not present

## 2018-10-20 ENCOUNTER — Other Ambulatory Visit: Payer: Self-pay | Admitting: "Endocrinology

## 2018-11-01 DIAGNOSIS — E041 Nontoxic single thyroid nodule: Secondary | ICD-10-CM | POA: Diagnosis not present

## 2018-11-01 DIAGNOSIS — O34219 Maternal care for unspecified type scar from previous cesarean delivery: Secondary | ICD-10-CM | POA: Diagnosis not present

## 2018-11-10 ENCOUNTER — Encounter: Payer: Self-pay | Admitting: "Endocrinology

## 2018-11-10 ENCOUNTER — Other Ambulatory Visit: Payer: Self-pay

## 2018-11-10 ENCOUNTER — Ambulatory Visit (INDEPENDENT_AMBULATORY_CARE_PROVIDER_SITE_OTHER): Payer: BC Managed Care – PPO | Admitting: "Endocrinology

## 2018-11-10 DIAGNOSIS — R897 Abnormal histological findings in specimens from other organs, systems and tissues: Secondary | ICD-10-CM | POA: Diagnosis not present

## 2018-11-10 DIAGNOSIS — E049 Nontoxic goiter, unspecified: Secondary | ICD-10-CM

## 2018-11-10 NOTE — Progress Notes (Signed)
11/10/2018, 4:44 PM                                Endocrinology Telehealth Visit Follow up Note -During COVID -19 Pandemic  I connected with Amy Nelson on 11/10/2018   by telephone and verified that I am speaking with the correct person using two identifiers. Amy Nelson, 06/03/1985. she has verbally consented to this visit. All issues noted in this document were discussed and addressed. The format was not optimal for physical exam.   Subjective:    Patient ID: Amy Nelson, female    DOB: Apr 05, 1985, PCP Health, [facility] Personal   Past Medical History:  Diagnosis Date  . Asthma    Past Surgical History:  Procedure Laterality Date  . BREAST REDUCTION SURGERY    . CESAREAN SECTION     Social History   Socioeconomic History  . Marital status: Single    Spouse name: Not on file  . Number of children: Not on file  . Years of education: Not on file  . Highest education level: Not on file  Occupational History  . Not on file  Social Needs  . Financial resource strain: Not on file  . Food insecurity    Worry: Not on file    Inability: Not on file  . Transportation needs    Medical: Not on file    Non-medical: Not on file  Tobacco Use  . Smoking status: Never Smoker  . Smokeless tobacco: Never Used  Substance and Sexual Activity  . Alcohol use: No  . Drug use: No  . Sexual activity: Yes    Birth control/protection: None  Lifestyle  . Physical activity    Days per week: Not on file    Minutes per session: Not on file  . Stress: Not on file  Relationships  . Social Herbalist on phone: Not on file    Gets together: Not on file    Attends religious service: Not on file    Active member of club or organization: Not on file    Attends meetings of clubs or organizations: Not on file    Relationship status: Not on file  Other Topics Concern  . Not on file  Social History  Narrative  . Not on file   Family History  Problem Relation Age of Onset  . Diabetes Mother   . Hypertension Mother   . Hypertension Father   . Hyperlipidemia Father    Outpatient Encounter Medications as of 11/10/2018  Medication Sig  . Prenatal Vit-Fe Fumarate-FA (PRENATAL MULTIVITAMIN) 60-1 MG tablet Take 1 tablet by mouth daily.     No facility-administered encounter medications on file as of 11/10/2018.    ALLERGIES: No Known Allergies  VACCINATION STATUS:  There is no immunization history on file for this patient.  HPI Amy Nelson is 33 y.o. female who is being engaged in telehealth via telephone after she was seen in consultation for nodular goiter.  She is currently pregnant at [redacted] weeks of gestation.    She underwent fine-needle aspiration of an nodular goiter on November 04, 2018.  Cytology report confirms atypia of undetermined significance.  Sample was sent for molecular studies.   She has no new complaints today. She has noticed goiter for several years, recently increasing in size.  She had recent normal thyroid function test.  Her ultrasound on September 10, 2018 showed 7.7 cm nodule on the left lobe which measures 8.5cm reportedly increasing in size compared to another study in February 2018.  -  She denies dysphagia, shortness of breath, nor voice change.  She has gained weight, appropriate to her pregnancy. She denies any family history of thyroid malignancy, however her mother has hypothyroidism on thyroid hormone supplement. She denies exposure to neck radiation.  She is not currently on thyroid hormone supplement nor antithyroid medications.   Review of Systems Limited as above.  Objective:    There were no vitals taken for this visit.  Wt Readings from Last 3 Encounters:  09/29/18 188 lb (85.3 kg)  11/24/15 184 lb (83.5 kg)  02/16/13 192 lb (87.1 kg)    Physical Exam   CMP ( most recent) CMP     Component Value Date/Time   NA 134 (L) 11/24/2015  0806   K 3.3 (L) 11/24/2015 0806   CL 106 11/24/2015 0806   CO2 21 (L) 11/24/2015 0806   GLUCOSE 105 (H) 11/24/2015 0806   BUN 6 11/24/2015 0806   CREATININE 0.46 11/24/2015 0806   CALCIUM 8.8 (L) 11/24/2015 0806   GFRNONAA >60 11/24/2015 0806   GFRAA >60 11/24/2015 0806     Summary of her thyroid ultrasound from September 10, 2018: Right lobe 5.1 cm x 1.2 cm x 1.3 cm-no nodules. Left lobe 8.5 mm x 5.3 cm x 4.4 cm with 7.7 cm nodule observed to increase in size compared to prior study from February 2018.  Thyroid fine-needle aspiration cytology: Atypia of undetermined significance.  The smears are cellular with sheets of follicular cells, follicles and colloid.  There are scattered microfollicular aggregates present.  No papillary architecture or Hurthle cell change. Concurrently collected Afirma sample will be sent.  Assessment & Plan:   1. Nodular goiter  2.  Abnormal FNA biopsy 3.  Pregnant-22 weeks  -Her FNA cytology is abnormal.  I had a long discussion with her about these findings.  Atypia of undetermined significance.  A sample was appropriately sent for Afirma molecular studies. -She will return in 5 weeks to discuss the results.  If her risk of malignancy is confirmed, she will be considered for thyroidectomy. By virtue of the size of her thyroid, surgical treatment may be unavoidable in her case, preferably  before she enters third trimester. If her FNA cytology is negative for malignancy, she will be observed with periodic thyroid ultrasound.  She does not have compressive symptoms at this time.   Based on her recent thyroid function test, she does not need intervention with thyroid hormone supplement nor antithyroid medication.  - I did not initiate any new prescriptions today. - I advised her  to maintain close follow up with Health, [facility] Dept Personal for primary care needs, as well as her OB/GYN given her pregnancy.    Time for this visit: 25  minutes. Manahil D Garde  participated in the discussions, expressed understanding, and voiced agreement with the above plans.  All questions were answered to her satisfaction. she is encouraged to contact clinic should she have any questions or concerns prior to her return visit.   Follow up plan: Return in about 5  weeks (around 12/15/2018), or phone visit, awaiting melecular study results.   Glade Lloyd, MD [facility] [address] [place], [place] [address] Phone: [phone]  Fax: [phone]     11/10/2018, 4:44 PM  This note was partially dictated with voice recognition software. Similar sounding words can be transcribed inadequately or may not  be corrected upon review.

## 2018-11-15 ENCOUNTER — Ambulatory Visit: Payer: BC Managed Care – PPO | Admitting: "Endocrinology

## 2018-11-17 ENCOUNTER — Ambulatory Visit: Payer: BC Managed Care – PPO | Admitting: "Endocrinology

## 2018-11-23 ENCOUNTER — Other Ambulatory Visit: Payer: Self-pay

## 2018-11-23 ENCOUNTER — Encounter: Payer: Self-pay | Admitting: "Endocrinology

## 2018-11-23 ENCOUNTER — Ambulatory Visit (INDEPENDENT_AMBULATORY_CARE_PROVIDER_SITE_OTHER): Payer: BC Managed Care – PPO | Admitting: "Endocrinology

## 2018-11-23 DIAGNOSIS — E042 Nontoxic multinodular goiter: Secondary | ICD-10-CM | POA: Diagnosis not present

## 2018-11-23 DIAGNOSIS — C73 Malignant neoplasm of thyroid gland: Secondary | ICD-10-CM

## 2018-11-23 NOTE — Progress Notes (Signed)
11/23/2018, 1:37 PM                                    Endocrinology Telehealth Visit Follow up Note -During COVID -19 Pandemic  I connected with Amy Nelson on 11/23/2018   by telephone and verified that I am speaking with the correct person using two identifiers. Amy Nelson, May 02, 1985. she has verbally consented to this visit. All issues noted in this document were discussed and addressed. The format was not optimal for physical exam.  Subjective:    Patient ID: Amy Nelson, female    DOB: 03/24/1985, PCP Health, [facility] Personal   Past Medical History:  Diagnosis Date  . Asthma    Past Surgical History:  Procedure Laterality Date  . BREAST REDUCTION SURGERY    . CESAREAN SECTION     Social History   Socioeconomic History  . Marital status: Single    Spouse name: Not on file  . Number of children: Not on file  . Years of education: Not on file  . Highest education level: Not on file  Occupational History  . Not on file  Social Needs  . Financial resource strain: Not on file  . Food insecurity    Worry: Not on file    Inability: Not on file  . Transportation needs    Medical: Not on file    Non-medical: Not on file  Tobacco Use  . Smoking status: Never Smoker  . Smokeless tobacco: Never Used  Substance and Sexual Activity  . Alcohol use: No  . Drug use: No  . Sexual activity: Yes    Birth control/protection: None  Lifestyle  . Physical activity    Days per week: Not on file    Minutes per session: Not on file  . Stress: Not on file  Relationships  . Social Herbalist on phone: Not on file    Gets together: Not on file    Attends religious service: Not on file    Active member of club or organization: Not on file    Attends meetings of clubs or organizations: Not on file    Relationship status: Not on file  Other Topics Concern  . Not on file  Social History  Narrative  . Not on file   Family History  Problem Relation Age of Onset  . Diabetes Mother   . Hypertension Mother   . Hypertension Father   . Hyperlipidemia Father    Outpatient Encounter Medications as of 11/23/2018  Medication Sig  . Prenatal Vit-Fe Fumarate-FA (PRENATAL MULTIVITAMIN) 60-1 MG tablet Take 1 tablet by mouth daily.     No facility-administered encounter medications on file as of 11/23/2018.    ALLERGIES: No Known Allergies  VACCINATION STATUS:  There is no immunization history on file for this patient.  HPI Amy Nelson is 33 y.o. female who is being engaged in telehealth via telephone after she was seen in consultation for nodular goiter.  She is currently pregnant at [redacted] weeks of gestation.    She underwent fine-needle aspiration of an nodular  goiter on November 04, 2018.  Cytology report confirms atypia of undetermined significance.  Subsequent molecular studies confirm approximately 50% risk of malignancy.   She has no new complaints today. She has noticed goiter for several years, recently increasing in size.  She had recent normal thyroid function test.  Her ultrasound on September 10, 2018 showed 7.7 cm nodule on the left lobe which measures 8.5cm reportedly increasing in size compared to another study in February 2018.  -  She denies dysphagia, shortness of breath, nor voice change.  She has gained weight, appropriate to her pregnancy. She denies any family history of thyroid malignancy, however her mother has hypothyroidism on thyroid hormone supplement. She denies exposure to neck radiation.  She is not currently on thyroid hormone supplement nor antithyroid medications.   Review of Systems Limited as above.  Objective:    There were no vitals taken for this visit.  Wt Readings from Last 3 Encounters:  09/29/18 188 lb (85.3 kg)  11/24/15 184 lb (83.5 kg)  02/16/13 192 lb (87.1 kg)    Physical Exam   CMP ( most recent) CMP     Component  Value Date/Time   NA 134 (L) 11/24/2015 0806   K 3.3 (L) 11/24/2015 0806   CL 106 11/24/2015 0806   CO2 21 (L) 11/24/2015 0806   GLUCOSE 105 (H) 11/24/2015 0806   BUN 6 11/24/2015 0806   CREATININE 0.46 11/24/2015 0806   CALCIUM 8.8 (L) 11/24/2015 0806   GFRNONAA >60 11/24/2015 0806   GFRAA >60 11/24/2015 0806     Summary of her thyroid ultrasound from September 10, 2018: Right lobe 5.1 cm x 1.2 cm x 1.3 cm-no nodules. Left lobe 8.5 mm x 5.3 cm x 4.4 cm with 7.7 cm nodule observed to increase in size compared to prior study from February 2018.  Thyroid fine-needle aspiration cytology: Atypia of undetermined significance.  The smears are cellular with sheets of follicular cells, follicles and colloid.  There are scattered microfollicular aggregates present.  No papillary architecture or Hurthle cell change.  Afirma 50% risk of Malignancy  Assessment & Plan:    1. Thyroid malignancy  2.  Nodular goiter  3.  Pregnant-24 weeks  -Her FNA cytology is abnormal.  I had a long discussion with her about these findings.  Atypia of undetermined significance.  A sample was appropriately sent for Afirma molecular studies, which confirmed approximately 50% risk of malignancy. -This was discussed with the patient and agrees with next option of total thyroidectomy. We will initiate expedited consult with  Dr. Armandina Gemma in [place]. -she understands the subsequent need for thyroid hormone replacement. - I did not initiate any new prescriptions today. - I advised her  to maintain close follow up with Health, [facility] Dept Personal for primary care needs, as well as her OB/GYN given her pregnancy.   Time for this visit: 15 minutes. Amy Nelson  participated in the discussions, expressed understanding, and voiced agreement with the above plans.  All questions were answered to her satisfaction. she is encouraged to contact clinic should she have any questions or concerns prior to  her return visit.   Follow up plan: Return in about 5 weeks (around 12/28/2018) for Follow up with Pre-visit Labs.   Glade Lloyd, MD [facility] [address] [place], [place] [address] Phone: [phone]  Fax: [phone]     11/23/2018, 1:37 PM  This note was partially dictated with voice  recognition software. Similar sounding words can be transcribed inadequately or may not  be corrected upon review.

## 2018-11-25 ENCOUNTER — Telehealth: Payer: Self-pay | Admitting: "Endocrinology

## 2018-11-25 NOTE — Telephone Encounter (Signed)
Pt states she was supposed to have surgery sooner than Nov. 23rd. Dr Dorris Fetch had already been notified of this. Told pt we were working on getting her surgery scheduled sooner and we would call her back.

## 2018-11-25 NOTE — Telephone Encounter (Signed)
Pt would like you to call her.

## 2018-11-30 ENCOUNTER — Telehealth: Payer: Self-pay | Admitting: "Endocrinology

## 2018-11-30 DIAGNOSIS — R52 Pain, unspecified: Secondary | ICD-10-CM | POA: Diagnosis not present

## 2018-11-30 DIAGNOSIS — M545 Low back pain: Secondary | ICD-10-CM | POA: Diagnosis not present

## 2018-11-30 NOTE — Telephone Encounter (Signed)
Amy Nelson is requesting to speak to Dr Dorris Fetch. She states she just has a couple of questions concerning her surgery. [phone]

## 2018-11-30 NOTE — Telephone Encounter (Signed)
I spoke with the patient and answered her questions.

## 2018-12-08 ENCOUNTER — Ambulatory Visit: Payer: Self-pay | Admitting: Surgery

## 2018-12-08 DIAGNOSIS — E049 Nontoxic goiter, unspecified: Secondary | ICD-10-CM | POA: Diagnosis not present

## 2018-12-08 DIAGNOSIS — D440 Neoplasm of uncertain behavior of thyroid gland: Secondary | ICD-10-CM | POA: Diagnosis not present

## 2018-12-11 ENCOUNTER — Other Ambulatory Visit (HOSPITAL_COMMUNITY)
Admission: RE | Admit: 2018-12-11 | Discharge: 2018-12-11 | Disposition: A | Discharge status: Home / Self Care | Payer: BC Managed Care – PPO | Source: Ambulatory Visit | Attending: Surgery | Admitting: Surgery

## 2018-12-11 DIAGNOSIS — Z01812 Encounter for preprocedural laboratory examination: Secondary | ICD-10-CM | POA: Diagnosis not present

## 2018-12-11 DIAGNOSIS — Z20828 Contact with and (suspected) exposure to other viral communicable diseases: Secondary | ICD-10-CM | POA: Diagnosis not present

## 2018-12-12 ENCOUNTER — Encounter (HOSPITAL_COMMUNITY): Payer: Self-pay | Admitting: Surgery

## 2018-12-12 DIAGNOSIS — D440 Neoplasm of uncertain behavior of thyroid gland: Secondary | ICD-10-CM | POA: Diagnosis present

## 2018-12-12 NOTE — H&P (Signed)
General Surgery [facility]  Amy Nelson DOB: [birth year] Single / Language: [place] / Race: Black or African American Female   History of Present Illness   The patient is a 33 year old female who presents with a thyroid nodule.  CHIEF COMPLAINT: enlarging thyroid mass of uncertain behavior  Patient is referred by Dr. Bud Face for surgical evaluation and management of an enlarging thyroid mass with compressive symptoms. Patient was first noted to have a thyroid nodule as part of her prenatal examination. She underwent an ultrasound examination showing a dominant mass measuring 7.7 cm in the left thyroid lobe. Right thyroid lobe was mildly enlarged without nodules. Patient underwent fine-needle aspiration biopsy which showed cytologic atypia of uncertain significance. Tissue was sent for molecular genetic testing, AFIRMA, and returned as suspicious, rendering a 50% risk of malignancy. Patient has noted gradual enlargement of the mass during her current pregnancy. She is approximately [redacted] weeks pregnant at this time. Baby is due in February. Patient has experienced symptoms especially when lying down of shortness of breath and pressure. She often awakens at night short of breath and gasping for air. She has had some minor dysphagia. Patient is not on thyroid medication. She has had no prior thyroid problems. She has had no prior head or neck surgery. There is no family history of thyroid disease. Patient works for [employer] in [place]. She presents today to discuss thyroid surgery for definitive diagnosis and management.   Past Surgical History Cesarean Section - Multiple  Mammoplasty; Reduction  Bilateral.  Diagnostic Studies History Colonoscopy  never Mammogram  never Pap Smear  1-5 years ago  Allergies No Known Drug Allergies Allergies Reconciled   Medication History Prenatal (27-1MG  Tablet, Oral) Active. Medications  Reconciled  Social History Alcohol use  Remotely quit alcohol use. Caffeine use  Tea. No drug use  Tobacco use  Never smoker.  Family History Diabetes Mellitus  Mother. Hypertension  Father, Mother. Migraine Headache  Brother.  Pregnancy / Birth History  Age at menarche  38 years. Contraceptive History  Depo-provera. Gravida  7 Length (months) of breastfeeding  7-12 Maternal age  25-25 Para  3 Regular periods   Other Problems Asthma  Migraine Headache  Thyroid Cancer     Review of Systems General Present- Fatigue. Not Present- Appetite Loss, Chills, Fever, Night Sweats, Weight Gain and Weight Loss. Skin Not Present- Change in Wart/Mole, Dryness, Hives, Jaundice, New Lesions, Non-Healing Wounds, Rash and Ulcer. HEENT Present- Seasonal Allergies. Not Present- Earache, Hearing Loss, Hoarseness, Nose Bleed, Oral Ulcers, Ringing in the Ears, Sinus Pain, Sore Throat, Visual Disturbances, Wears glasses/contact lenses and Yellow Eyes. Respiratory Present- Snoring. Not Present- Bloody sputum, Chronic Cough, Difficulty Breathing and Wheezing. Breast Not Present- Breast Mass, Breast Pain, Nipple Discharge and Skin Changes. Cardiovascular Not Present- Chest Pain, Difficulty Breathing Lying Down, Leg Cramps, Palpitations, Rapid Heart Rate, Shortness of Breath and Swelling of Extremities. Gastrointestinal Not Present- Abdominal Pain, Bloating, Bloody Stool, Change in Bowel Habits, Chronic diarrhea, Constipation, Difficulty Swallowing, Excessive gas, Gets full quickly at meals, Hemorrhoids, Indigestion, Nausea, Rectal Pain and Vomiting. Female Genitourinary Not Present- Frequency, Nocturia, Painful Urination, Pelvic Pain and Urgency. Musculoskeletal Not Present- Back Pain, Joint Pain, Joint Stiffness, Muscle Pain, Muscle Weakness and Swelling of Extremities. Neurological Present- Headaches. Not Present- Decreased Memory, Fainting, Numbness, Seizures, Tingling, Tremor,  Trouble walking and Weakness. Psychiatric Not Present- Anxiety, Bipolar, Change in Sleep Pattern, Depression, Fearful and Frequent crying. Endocrine Not Present- Cold Intolerance, Excessive Hunger, Hair  Changes, Heat Intolerance, Hot flashes and New Diabetes. Hematology Not Present- Blood Thinners, Easy Bruising, Excessive bleeding, Gland problems, HIV and Persistent Infections.  Vitals Weight: 194.8 lb Height: 62in Body Surface Area: 1.89 m Body Mass Index: 35.63 kg/m  Temp.: 97.52F(Temporal)  BP: 122/68 (Sitting, Left Arm, Standard)  Physical Exam   GENERAL APPEARANCE Development: normal Nutritional status: normal Gross deformities: none  SKIN Rash, lesions, ulcers: none Induration, erythema: none Nodules: none palpable  EYES Conjunctiva and lids: normal Pupils: equal and reactive Iris: normal bilaterally  EARS, NOSE, MOUTH, THROAT External ears: no lesion or deformity External nose: no lesion or deformity Hearing: grossly normal Patient is wearing a mask.  NECK Symmetric: no Trachea: shift to right Thyroid: There is a dominant mass arising from the left thyroid lobe and extending into the isthmus and across the midline. This is relatively firm. It is nontender. There are no discrete nodules palpable. There appears to be some tracheal deviation to the right. Voice quality is normal.  CHEST Respiratory effort: normal Retraction or accessory muscle use: no Breath sounds: normal bilaterally Rales, rhonchi, wheeze: none  CARDIOVASCULAR Auscultation: regular rhythm, normal rate Murmurs: none Pulses: carotid and radial pulse 2+ palpable Lower extremity edema: none Lower extremity varicosities: none  MUSCULOSKELETAL Station and gait: normal Digits and nails: no clubbing or cyanosis Muscle strength: grossly normal all extremities Range of motion: grossly normal all extremities Deformity: none  LYMPHATIC Cervical: none palpable Supraclavicular: none  palpable  PSYCHIATRIC Oriented to person, place, and time: yes Mood and affect: normal for situation Judgment and insight: appropriate for situation    Assessment & Plan  NEOPLASM OF UNCERTAIN BEHAVIOR OF THYROID GLAND (D44.0) ENLARGED THYROID (E04.9)  Pt Education - Pamphlet Given - The Thyroid Book: discussed with patient and provided information.  Patient presents today on referral from her endocrinologist for consideration for total thyroidectomy for management of thyroid neoplasm of uncertain behavior and enlarging thyroid mass. Patient is provided with written literature on thyroid surgery to review at home.  Patient has a dominant mass in the left lobe of the thyroid extending into the isthmus and causing some tracheal deviation towards the right. Biopsy has shown atypia and molecular genetic testing is suspicious, with a 50% risk of malignancy. Patient has developed some compressive symptoms including shortness of breath, air hunger, and dysphagia.  We discussed total thyroidectomy as a operation of choice. We discussed possibly doing a left thyroid lobectomy but given the anatomy and the symptoms and the risk of malignancy, I agree with her endocrinologist in favor total thyroidectomy for management. We discussed the risk and benefits of surgery including the risk of recurrent laryngeal nerve injury and injury to parathyroid glands. We discussed the hospital stay to be anticipated. We discussed her postoperative recovery and return to work.  Patient is approximately [redacted] weeks pregnant with her fourth child. We discussed the timing of surgery. Normally we would prefer to delay surgery until after the delivery of her child. However, that will be several months. Patient is already symptomatic with compressive symptoms. We discussed the risk of preterm labor. Patient would like to proceed with surgery as soon as possible. We will ask anesthesia to evaluate the patient as well  at her preoperative appointment.  We will plan to schedule the patient for surgery in the immediate future. I will enter orders today.  The risks and benefits of the procedure have been discussed at length with the patient. The patient understands the proposed procedure, potential alternative treatments,  and the course of recovery to be expected. All of the patient's questions have been answered at this time. The patient wishes to proceed with surgery.  Armandina Gemma, [facility] Office: [phone]

## 2018-12-13 LAB — NOVEL CORONAVIRUS, NAA (HOSP ORDER, SEND-OUT TO REF LAB; TAT 18-24 HRS): SARS-CoV-2, NAA: NOT DETECTED

## 2018-12-13 NOTE — Progress Notes (Signed)
PCP - [facility] Department -Dr. Laqueta Due  Cardiologist -   Chest x-ray -  EKG -  Stress Test -  ECHO -  Cardiac Cath -   Sleep Study -  CPAP -   Fasting Blood Sugar -  Checks Blood Sugar _____ times a day  Blood Thinner Instructions: Aspirin Instructions: Last Dose:  Anesthesia review:   Patient denies shortness of breath, fever, cough and chest pain at PAT appointment   Patient verbalized understanding of instructions that were given to them at the PAT appointment. Patient was also instructed that they will need to review over the PAT instructions again at home before surgery.

## 2018-12-13 NOTE — Patient Instructions (Addendum)
DUE TO COVID-19 ONLY ONE VISITOR IS ALLOWED TO COME WITH YOU AND STAY IN THE WAITING ROOM ONLY DURING PRE OP AND PROCEDURE. THE ONE VISITOR MAY VISIT WITH YOU IN YOUR PRIVATE ROOM DURING VISITING HOURS ONLY!!   COVID SWAB TESTING MUST BE COMPLETED ON: 12/11/2018  (Must self quarantine after testing. Follow instructions on handout.)              Your procedure is scheduled on: 12/15/2018   Report to [facility] Main  Entrance    Report to admitting at 1:00 PM   Call this number if you have problems the morning of surgery [phone]   Do not eat food or drink liquids :After Midnight.You are allowed to have a Clear Liquid diet until 12:00 PM  CLEAR LIQUID DIET  Foods Allowed                                                                     Foods Excluded  Water, Black Coffee and tea, regular and decaf                             liquids that you cannot  Plain Jell-O in any flavor  (No red)                                           see through such as: Fruit ices (not with fruit pulp)                                     milk, soups, orange juice  Iced Popsicles (No red)                                    All solid food Carbonated beverages, regular and diet                                    Apple juices Sports drinks like Gatorade (No red) Lightly seasoned clear broth or consume(fat free) Sugar, honey syrup  Sample Menu Breakfast                                Lunch                                     Supper Cranberry juice                    Beef broth                            Chicken broth Jell-O  Grape juice                           Apple juice Coffee or tea                        Jell-O                                      Popsicle                                                Coffee or tea                        Coffee or teaC[facility] - Pre[facility] for [facility]   Brush your teeth the morning of surgery.   Do NOT smoke after  Midnight   Take these medicines the morning of surgery with A SIP OF WATER: None                                You may not have any metal on your body including hair pins, jewelry, and body piercings             Do not wear make-up, lotions, powders, perfumes/cologne, or deodorant             Do not wear nail polish.  Do not shave  48 hours prior to surgery.                 Do not bring valuables to the hospital. [place].   Contacts, dentures or bridgework may not be worn into surgery.   Bring small overnight bag day of surgery.      Special Instructions: Bring a copy of your healthcare power of attorney and living will documents the day of surgery if you haven't scanned them in before.              Please read over the following fact sheets you were given:    Before surgery, you can play an important role.  Because skin is not sterile, your skin needs to be as free of germs as possible.  You can reduce the number of germs on your skin by washing with CHG (chlorahexidine gluconate) soap before surgery.  CHG is an antiseptic cleaner which kills germs and bonds with the skin to continue killing germs even after washing. Please DO NOT use if you have an allergy to CHG or antibacterial soaps.  If your skin becomes reddened/irritated stop using the CHG and inform your nurse when you arrive at Short Stay. Do not shave (including legs and underarms) for at least 48 hours prior to the first CHG shower.  You may shave your face/neck.  Please follow these instructions carefully:  1.  Shower with CHG Soap the night before surgery and the  morning of surgery.  2.  If you choose to wash your hair, wash your hair first as usual with your normal  shampoo.  3.  After you shampoo, rinse  your hair and body thoroughly to remove the shampoo.                             4.  Use CHG as you would any other liquid soap.  You can apply chg directly to the  skin and wash.  Gently with a scrungie or clean washcloth.  5.  Apply the CHG Soap to your body ONLY FROM THE NECK DOWN.   Do   not use on face/ open                           Wound or open sores. Avoid contact with eyes, ears mouth and   genitals (private parts).                       Wash face,  Genitals (private parts) with your normal soap.             6.  Wash thoroughly, paying special attention to the area where your    surgery  will be performed.  7.  Thoroughly rinse your body with warm water from the neck down.  8.  DO NOT shower/wash with your normal soap after using and rinsing off the CHG Soap.                9.  Pat yourself dry with a clean towel.            10.  Wear clean pajamas.            11.  Place clean sheets on your bed the night of your first shower and do not  sleep with pets. Day of Surgery : Do not apply any lotions/deodorants the morning of surgery.  Please wear clean clothes to the hospital/surgery center.  FAILURE TO FOLLOW THESE INSTRUCTIONS MAY RESULT IN THE CANCELLATION OF YOUR SURGERY  PATIENT SIGNATURE_________________________________  NURSE SIGNATURE__________________________________  ________________________________________________________________________

## 2018-12-13 NOTE — Progress Notes (Addendum)
Spoke to [place] on 12/13/18 regarding Dr. Gala Lewandowsky order for consult to anesthesiology related to pt's [redacted] weeks EGA.  Per Konrad Felix, PA, contact Dr. Gala Lewandowsky orders to  advise that we need to obtain fetal monitoring orders from pt's OB/GYN physician, which will be implement by our Rapid Response nurse during the surgery... Spoke to Triage Nurse Abigail Butts to advise of the above. Abigail Butts verbalized understanding, and will work on obtaining monitoring orders from [employer] provider.    Abigail Butts called on 11/24/220  to stated that she is faxing "monitoring orders" from  Tollie Eth, NP at [facility] Department. [facility] providers doesn't have Scientist, forensic at [facility]. Their patients are sent to [facility] for delivery.   Junie Panning, Ob/Gyn Rapid Response nurse contacted regarding the above. Per Junie Panning, if provider doesnot have practicing privileges at [facility], the surgeon would have to speak to [facility]'s on-call OB-Gyn provider to see if they will take responsibility of the patient while they are on the [facility]. The On-call provider today is Dr. Rosana Hoes, who can be reached at [phone]. If Dr. Rosana Hoes is willing to accept responsibility, she can then contact Tollie Eth, NP at the public health department to obtain orders. Dr. Rosana Hoes can then place orders in [facility] for the Ob/Gyn Rapid Response nurse to implement. Abigail Butts made aware of the above, and will speak with surgeon.  FYI-Per pt, she is [redacted] weeks gestational, not 24 weeks as indicated in Dr. Gala Lewandowsky consult order to anesthesiology, and progress note.

## 2018-12-14 ENCOUNTER — Other Ambulatory Visit: Payer: Self-pay

## 2018-12-14 ENCOUNTER — Encounter (HOSPITAL_COMMUNITY)
Admission: RE | Admit: 2018-12-14 | Discharge: 2018-12-14 | Disposition: A | Discharge status: Home / Self Care | Payer: BC Managed Care – PPO | Source: Ambulatory Visit | Attending: Surgery | Admitting: Surgery

## 2018-12-14 ENCOUNTER — Encounter (HOSPITAL_COMMUNITY): Payer: Self-pay

## 2018-12-14 ENCOUNTER — Encounter: Payer: Self-pay | Admitting: Obstetrics and Gynecology

## 2018-12-14 DIAGNOSIS — Z01818 Encounter for other preprocedural examination: Secondary | ICD-10-CM | POA: Insufficient documentation

## 2018-12-14 HISTORY — DX: Type 2 diabetes mellitus without complications: E11.9

## 2018-12-14 LAB — CBC
HCT: 35.7 % — ABNORMAL LOW (ref 36.0–46.0)
Hemoglobin: 11.5 g/dL — ABNORMAL LOW (ref 12.0–15.0)
MCH: 30.6 pg (ref 26.0–34.0)
MCHC: 32.2 g/dL (ref 30.0–36.0)
MCV: 94.9 fL (ref 80.0–100.0)
Platelets: 257 10*3/uL (ref 150–400)
RBC: 3.76 MIL/uL — ABNORMAL LOW (ref 3.87–5.11)
RDW: 13.4 % (ref 11.5–15.5)
WBC: 10 10*3/uL (ref 4.0–10.5)
nRBC: 0 % (ref 0.0–0.2)

## 2018-12-14 LAB — HEMOGLOBIN A1C
Hgb A1c MFr Bld: 5.5 % (ref 4.8–5.6)
Mean Plasma Glucose: 111.15 mg/dL

## 2018-12-14 LAB — GLUCOSE, CAPILLARY: Glucose-Capillary: 147 mg/dL — ABNORMAL HIGH (ref 70–99)

## 2018-12-14 NOTE — Progress Notes (Signed)
Confirmed with Dr Harlow Asa  "the surgeon would have to speak to [facility]'s on-call OB-Gyn provider to see if they will take responsibility of the patient while they are on the [facility]. The On-call provider today is Dr. Rosana Hoes, who can be reached at [phone]. If Dr. Rosana Hoes is willing to accept responsibility, she can then contact Tollie Eth, NP at the public health department to obtain orders. Dr. Rosana Hoes can then place orders in [facility] for the Ob/Gyn Rapid Response nurse to implement. Abigail Butts made aware of the above, and will speak with surgeon"  Orders needed for pre and post anesthesia FHT monitoring as she is >23 weeks. Note indicates patient states she is 27 weeks with 4th child.

## 2018-12-14 NOTE — Progress Notes (Signed)
Asked by Dr. Harlow Asa of [facility] to place fetal heart rate monitoring orders in anticipation of surgery tomorrow. Per chart, patient is [redacted]w[redacted]d pregnant and a patient of the [facility]. She is for total thyroidectomy for enlarging thyroid in pregnancy with FNA/genetics suspicious for malignancy. Per records in media, patient has had 3 prior c-sections but otherwise appears to be healthy. Orders placed for 30 min FHT pre-op and 30 min FHT in PACU. ROB aware of patient and orders.

## 2018-12-15 ENCOUNTER — Other Ambulatory Visit: Payer: Self-pay

## 2018-12-15 ENCOUNTER — Ambulatory Visit (HOSPITAL_COMMUNITY): Payer: BC Managed Care – PPO | Admitting: Anesthesiology

## 2018-12-15 ENCOUNTER — Ambulatory Visit (HOSPITAL_COMMUNITY): Payer: BC Managed Care – PPO | Admitting: Physician Assistant

## 2018-12-15 ENCOUNTER — Inpatient Hospital Stay (HOSPITAL_COMMUNITY)
Admission: RE | Admit: 2018-12-15 | Discharge: 2018-12-16 | Disposition: A | Discharge status: Home / Self Care | Payer: BC Managed Care – PPO | Attending: Surgery | Admitting: Surgery

## 2018-12-15 ENCOUNTER — Encounter (HOSPITAL_COMMUNITY): Payer: Self-pay | Admitting: Surgery

## 2018-12-15 ENCOUNTER — Encounter (HOSPITAL_COMMUNITY)
Admission: RE | Disposition: A | Discharge status: Home / Self Care | Payer: Self-pay | Source: Home / Self Care | Attending: Surgery

## 2018-12-15 DIAGNOSIS — E890 Postprocedural hypothyroidism: Secondary | ICD-10-CM

## 2018-12-15 DIAGNOSIS — Z9889 Other specified postprocedural states: Secondary | ICD-10-CM

## 2018-12-15 DIAGNOSIS — O99212 Obesity complicating pregnancy, second trimester: Secondary | ICD-10-CM | POA: Diagnosis not present

## 2018-12-15 DIAGNOSIS — E119 Type 2 diabetes mellitus without complications: Secondary | ICD-10-CM | POA: Diagnosis not present

## 2018-12-15 DIAGNOSIS — E041 Nontoxic single thyroid nodule: Secondary | ICD-10-CM | POA: Insufficient documentation

## 2018-12-15 DIAGNOSIS — D440 Neoplasm of uncertain behavior of thyroid gland: Secondary | ICD-10-CM | POA: Insufficient documentation

## 2018-12-15 DIAGNOSIS — J45909 Unspecified asthma, uncomplicated: Secondary | ICD-10-CM | POA: Diagnosis not present

## 2018-12-15 DIAGNOSIS — Z9089 Acquired absence of other organs: Secondary | ICD-10-CM

## 2018-12-15 DIAGNOSIS — Z3A24 24 weeks gestation of pregnancy: Secondary | ICD-10-CM | POA: Diagnosis not present

## 2018-12-15 DIAGNOSIS — O99282 Endocrine, nutritional and metabolic diseases complicating pregnancy, second trimester: Principal | ICD-10-CM | POA: Insufficient documentation

## 2018-12-15 DIAGNOSIS — E669 Obesity, unspecified: Secondary | ICD-10-CM | POA: Diagnosis not present

## 2018-12-15 DIAGNOSIS — O99512 Diseases of the respiratory system complicating pregnancy, second trimester: Secondary | ICD-10-CM | POA: Insufficient documentation

## 2018-12-15 DIAGNOSIS — C73 Malignant neoplasm of thyroid gland: Secondary | ICD-10-CM | POA: Diagnosis not present

## 2018-12-15 DIAGNOSIS — E049 Nontoxic goiter, unspecified: Secondary | ICD-10-CM | POA: Diagnosis not present

## 2018-12-15 HISTORY — PX: THYROIDECTOMY: SHX17

## 2018-12-15 LAB — GLUCOSE, CAPILLARY: Glucose-Capillary: 70 mg/dL (ref 70–99)

## 2018-12-15 SURGERY — THYROIDECTOMY
Anesthesia: General

## 2018-12-15 SURGERY — THYROIDECTOMY
Anesthesia: General | Site: Throat

## 2018-12-15 MED ORDER — ONDANSETRON 4 MG PO TBDP: 4.0000 mg | ORAL_TABLET | Freq: Four times a day (QID) | ORAL | Status: DC | PRN

## 2018-12-15 MED ORDER — CEFAZOLIN SODIUM-DEXTROSE 2-4 GM/100ML-% IV SOLN
INTRAVENOUS | Status: AC
  Filled 2018-12-15: qty 100

## 2018-12-15 MED ORDER — CALCIUM CARBONATE 1250 (500 CA) MG PO TABS
2.0000 | ORAL_TABLET | Freq: Three times a day (TID) | ORAL | Status: DC
  Administered 2018-12-16 (×2): 1000 mg via ORAL
  Filled 2018-12-15 (×3): qty 2

## 2018-12-15 MED ORDER — DEXAMETHASONE SODIUM PHOSPHATE 4 MG/ML IJ SOLN
INTRAMUSCULAR | Status: DC | PRN
  Administered 2018-12-15: 5 mg via INTRAVENOUS

## 2018-12-15 MED ORDER — 0.9 % SODIUM CHLORIDE (POUR BTL) OPTIME
TOPICAL | Status: DC | PRN
  Administered 2018-12-15: 1000 mL

## 2018-12-15 MED ORDER — PHENYLEPHRINE HCL (PRESSORS) 10 MG/ML IV SOLN
INTRAVENOUS | Status: DC | PRN
  Administered 2018-12-15: 80 ug via INTRAVENOUS

## 2018-12-15 MED ORDER — ONDANSETRON HCL 4 MG/2ML IJ SOLN: 4.0000 mg | Freq: Once | INTRAMUSCULAR | Status: DC | PRN

## 2018-12-15 MED ORDER — CHLORHEXIDINE GLUCONATE CLOTH 2 % EX PADS: 6.0000 | MEDICATED_PAD | Freq: Once | CUTANEOUS | Status: DC

## 2018-12-15 MED ORDER — ONDANSETRON HCL 4 MG/2ML IJ SOLN
4.0000 mg | Freq: Four times a day (QID) | INTRAMUSCULAR | Status: DC | PRN
  Administered 2018-12-16: 09:00:00 4 mg via INTRAVENOUS
  Filled 2018-12-15: qty 2

## 2018-12-15 MED ORDER — KCL IN DEXTROSE-NACL 20-5-0.45 MEQ/L-%-% IV SOLN
INTRAVENOUS | Status: DC
  Administered 2018-12-15: 21:00:00 via INTRAVENOUS
  Filled 2018-12-15 (×2): qty 1000

## 2018-12-15 MED ORDER — PROPOFOL 10 MG/ML IV BOLUS
INTRAVENOUS | Status: DC | PRN
  Administered 2018-12-15: 130 mg via INTRAVENOUS

## 2018-12-15 MED ORDER — PROPOFOL 10 MG/ML IV BOLUS
INTRAVENOUS | Status: AC
  Filled 2018-12-15: qty 20

## 2018-12-15 MED ORDER — LIDOCAINE HCL (CARDIAC) PF 100 MG/5ML IV SOSY
PREFILLED_SYRINGE | INTRAVENOUS | Status: DC | PRN
  Administered 2018-12-15: 40 mg via INTRAVENOUS

## 2018-12-15 MED ORDER — HYDROMORPHONE HCL 1 MG/ML IJ SOLN
1.0000 mg | INTRAMUSCULAR | Status: DC | PRN
  Administered 2018-12-15 – 2018-12-16 (×4): 1 mg via INTRAVENOUS
  Filled 2018-12-15 (×4): qty 1

## 2018-12-15 MED ORDER — SUGAMMADEX SODIUM 200 MG/2ML IV SOLN
INTRAVENOUS | Status: DC | PRN
  Administered 2018-12-15: 200 mg via INTRAVENOUS

## 2018-12-15 MED ORDER — HYDROCODONE-ACETAMINOPHEN 5-325 MG PO TABS
1.0000 | ORAL_TABLET | ORAL | Status: DC | PRN
  Administered 2018-12-16 (×2): 2 via ORAL
  Filled 2018-12-15 (×2): qty 2

## 2018-12-15 MED ORDER — PHENYLEPHRINE HCL-NACL 10-0.9 MG/250ML-% IV SOLN
INTRAVENOUS | Status: DC | PRN
  Administered 2018-12-15: 15 ug/min via INTRAVENOUS

## 2018-12-15 MED ORDER — CEFAZOLIN SODIUM-DEXTROSE 2-4 GM/100ML-% IV SOLN
2.0000 g | INTRAVENOUS | Status: AC
  Administered 2018-12-15: 2 g via INTRAVENOUS

## 2018-12-15 MED ORDER — HYDROMORPHONE HCL 1 MG/ML IJ SOLN
0.2500 mg | INTRAMUSCULAR | Status: DC | PRN
  Administered 2018-12-15 (×2): 0.5 mg via INTRAVENOUS

## 2018-12-15 MED ORDER — FENTANYL CITRATE (PF) 100 MCG/2ML IJ SOLN
INTRAMUSCULAR | Status: DC | PRN
  Administered 2018-12-15 (×2): 50 ug via INTRAVENOUS
  Administered 2018-12-15: 100 ug via INTRAVENOUS
  Administered 2018-12-15: 50 ug via INTRAVENOUS

## 2018-12-15 MED ORDER — ACETAMINOPHEN 325 MG PO TABS: 650.0000 mg | ORAL_TABLET | Freq: Four times a day (QID) | ORAL | Status: DC | PRN

## 2018-12-15 MED ORDER — STERILE WATER FOR IRRIGATION IR SOLN
Status: DC | PRN
  Administered 2018-12-15: 1000 mL

## 2018-12-15 MED ORDER — HEMOSTATIC AGENTS (NO CHARGE) OPTIME
TOPICAL | Status: DC | PRN
  Administered 2018-12-15 (×2): 1 via TOPICAL

## 2018-12-15 MED ORDER — TRAMADOL HCL 50 MG PO TABS: 50.0000 mg | ORAL_TABLET | Freq: Four times a day (QID) | ORAL | Status: DC | PRN

## 2018-12-15 MED ORDER — OXYCODONE HCL 5 MG PO TABS: 5.0000 mg | ORAL_TABLET | Freq: Once | ORAL | Status: DC | PRN

## 2018-12-15 MED ORDER — HYDROMORPHONE HCL 1 MG/ML IJ SOLN
INTRAMUSCULAR | Status: AC
  Filled 2018-12-15: qty 1

## 2018-12-15 MED ORDER — LACTATED RINGERS IV SOLN
INTRAVENOUS | Status: DC | PRN
  Administered 2018-12-15 (×2): via INTRAVENOUS

## 2018-12-15 MED ORDER — ACETAMINOPHEN 650 MG RE SUPP: 650.0000 mg | Freq: Four times a day (QID) | RECTAL | Status: DC | PRN

## 2018-12-15 MED ORDER — FENTANYL CITRATE (PF) 250 MCG/5ML IJ SOLN
INTRAMUSCULAR | Status: AC
  Filled 2018-12-15: qty 5

## 2018-12-15 MED ORDER — ONDANSETRON HCL 4 MG/2ML IJ SOLN
INTRAMUSCULAR | Status: DC | PRN
  Administered 2018-12-15: 4 mg via INTRAVENOUS

## 2018-12-15 MED ORDER — ROCURONIUM BROMIDE 100 MG/10ML IV SOLN
INTRAVENOUS | Status: DC | PRN
  Administered 2018-12-15 (×2): 10 mg via INTRAVENOUS
  Administered 2018-12-15: 50 mg via INTRAVENOUS

## 2018-12-15 MED ORDER — OXYCODONE HCL 5 MG/5ML PO SOLN: 5.0000 mg | Freq: Once | ORAL | Status: DC | PRN

## 2018-12-15 MED ORDER — SUCCINYLCHOLINE CHLORIDE 20 MG/ML IJ SOLN
INTRAMUSCULAR | Status: DC | PRN
  Administered 2018-12-15: 80 mg via INTRAVENOUS

## 2018-12-15 SURGICAL SUPPLY — 50 items
ATTRACTOMAT 16X20 MAGNETIC DRP (DRAPES) ×2 IMPLANT
BLADE CLIPPER SURG (BLADE) IMPLANT
BLADE SURG 10 STRL SS (BLADE) ×2 IMPLANT
BLADE SURG 15 STRL LF DISP TIS (BLADE) ×1 IMPLANT
BLADE SURG 15 STRL SS (BLADE) ×1
CANISTER SUCT 3000ML PPV (MISCELLANEOUS) ×2 IMPLANT
CHLORAPREP W/TINT 10.5 ML (MISCELLANEOUS) ×2 IMPLANT
CLIP VESOCCLUDE MED 24/CT (CLIP) ×4 IMPLANT
CLIP VESOCCLUDE SM WIDE 24/CT (CLIP) ×2 IMPLANT
CLSR STERI-STRIP ANTIMIC 1/2X4 (GAUZE/BANDAGES/DRESSINGS) ×2 IMPLANT
COVER SURGICAL LIGHT HANDLE (MISCELLANEOUS) ×2 IMPLANT
COVER WAND RF STERILE (DRAPES) IMPLANT
DERMABOND ADVANCED (GAUZE/BANDAGES/DRESSINGS) ×1
DERMABOND ADVANCED .7 DNX12 (GAUZE/BANDAGES/DRESSINGS) ×1 IMPLANT
DRAPE LAPAROTOMY 100X72 PEDS (DRAPES) ×2 IMPLANT
DRAPE UTILITY XL STRL (DRAPES) ×2 IMPLANT
ELECT CAUTERY BLADE 6.4 (BLADE) ×2 IMPLANT
ELECT REM PT RETURN 9FT ADLT (ELECTROSURGICAL) ×2
ELECTRODE REM PT RTRN 9FT ADLT (ELECTROSURGICAL) ×1 IMPLANT
GAUZE 4X4 16PLY RFD (DISPOSABLE) ×6 IMPLANT
GAUZE SPONGE 4X4 12PLY STRL (GAUZE/BANDAGES/DRESSINGS) ×2 IMPLANT
GLOVE SURG ORTHO 8.0 STRL STRW (GLOVE) ×2 IMPLANT
GOWN STRL REUS W/ TWL LRG LVL3 (GOWN DISPOSABLE) ×2 IMPLANT
GOWN STRL REUS W/ TWL XL LVL3 (GOWN DISPOSABLE) ×1 IMPLANT
GOWN STRL REUS W/TWL LRG LVL3 (GOWN DISPOSABLE) ×2
GOWN STRL REUS W/TWL XL LVL3 (GOWN DISPOSABLE) ×1
HEMOSTAT ARISTA ABSORB 3G PWDR (HEMOSTASIS) IMPLANT
HEMOSTAT SURGICEL 2X4 FIBR (HEMOSTASIS) ×4 IMPLANT
ILLUMINATOR WAVEGUIDE N/F (MISCELLANEOUS) ×2 IMPLANT
KIT BASIN OR (CUSTOM PROCEDURE TRAY) ×2 IMPLANT
KIT TURNOVER KIT B (KITS) ×2 IMPLANT
LIGHT WAVEGUIDE WIDE FLAT (MISCELLANEOUS) IMPLANT
NS IRRIG 1000ML POUR BTL (IV SOLUTION) ×2 IMPLANT
PACK SURGICAL SETUP 50X90 (CUSTOM PROCEDURE TRAY) ×2 IMPLANT
PAD ARMBOARD 7.5X6 YLW CONV (MISCELLANEOUS) ×2 IMPLANT
PENCIL BUTTON HOLSTER BLD 10FT (ELECTRODE) ×2 IMPLANT
POSITIONER HEAD DONUT 9IN (MISCELLANEOUS) ×2 IMPLANT
SHEARS HARMONIC 9CM CVD (BLADE) ×2 IMPLANT
SPECIMEN JAR MEDIUM (MISCELLANEOUS) ×2 IMPLANT
SPONGE INTESTINAL PEANUT (DISPOSABLE) ×2 IMPLANT
STRIP CLOSURE SKIN 1/2X4 (GAUZE/BANDAGES/DRESSINGS) ×2 IMPLANT
SUT MNCRL AB 4-0 PS2 18 (SUTURE) ×2 IMPLANT
SUT SILK 2 0 (SUTURE) ×1
SUT SILK 2-0 18XBRD TIE 12 (SUTURE) ×1 IMPLANT
SUT VIC AB 3-0 SH 18 (SUTURE) ×6 IMPLANT
SYR BULB 3OZ (MISCELLANEOUS) ×2 IMPLANT
TAPE CLOTH SURG 4X10 WHT LF (GAUZE/BANDAGES/DRESSINGS) ×2 IMPLANT
TOWEL GREEN STERILE (TOWEL DISPOSABLE) ×2 IMPLANT
TOWEL GREEN STERILE FF (TOWEL DISPOSABLE) ×2 IMPLANT
TUBE CONNECTING 12X1/4 (SUCTIONS) ×2 IMPLANT

## 2018-12-15 NOTE — Op Note (Signed)
Procedure Note  Pre-operative Diagnosis:  Thyroid neoplasm of uncertain behavior, thyroid goiter  Post-operative Diagnosis:  same  Surgeon:  Armandina Gemma, MD  Assistant:  none   Procedure:  Total thyroidectomy  Anesthesia:  General  Estimated Blood Loss:  350 cc  Drains: none         Specimen: thyroid to pathology  Indications:  Patient is referred by Dr. Bud Face for surgical evaluation and management of an enlarging thyroid mass with compressive symptoms. Patient was first noted to have a thyroid nodule as part of her prenatal examination. She underwent an ultrasound examination showing a dominant mass measuring 7.7 cm in the left thyroid lobe. Right thyroid lobe was mildly enlarged without nodules. Patient underwent fine-needle aspiration biopsy which showed cytologic atypia of uncertain significance. Tissue was sent for molecular genetic testing, AFIRMA, and returned as suspicious, rendering a 50% risk of malignancy. Patient has noted gradual enlargement of the mass during her current pregnancy. She is approximately [redacted] weeks pregnant at this time. Baby is due in February. Patient has experienced symptoms especially when lying down of shortness of breath and pressure. She often awakens at night short of breath and gasping for air. She has had some minor dysphagia. Patient is not on thyroid medication. She has had no prior thyroid problems. She has had no prior head or neck surgery. There is no family history of thyroid disease. Patient works for [employer] in [place]. She presents today to discuss thyroid surgery for definitive diagnosis and management.  Procedure Details: Procedure was done in OR #2 at the [facility]. The patient was brought to the operating room and placed in a supine position on the operating room table. Following administration of general anesthesia, the patient was positioned and then prepped and draped in the usual aseptic fashion. After  ascertaining that an adequate level of anesthesia had been achieved, a small Kocher incision was made with #15 blade. Dissection was carried through subcutaneous tissues and platysma.Hemostasis was achieved with the electrocautery. Skin flaps were elevated cephalad and caudad from the thyroid notch to the sternal notch. A Mahorner self-retaining retractor was placed for exposure. Strap muscles were incised in the midline and dissection was begun on the left side.  Strap muscles were reflected laterally.  Left thyroid lobe was markedly enlarged with a single large soft mass.  The left lobe was gently mobilized with blunt dissection. Superior pole vessels were dissected out and divided individually between small and medium ligaclips with the harmonic scalpel. The thyroid lobe was rolled anteriorly. Branches of the inferior thyroid artery were divided between small ligaclips with the harmonic scalpel. Inferior venous tributaries were divided between ligaclips. Both the superior and inferior parathyroid glands were identified and preserved on their vascular pedicles. The recurrent laryngeal nerve was identified and preserved along its course. The ligament of Gwenlyn Found was released with the electrocautery and the gland was mobilized onto the anterior trachea. Isthmus was mobilized across the midline. There was a moderate sized pyramidal lobe present which was dissected off of the anterior thyroid cartilage and resected with the isthmus. Dry pack was placed in the left neck.  The right thyroid lobe was gently mobilized with blunt dissection. Right thyroid lobe was normal in size without significant nodules. Superior pole vessels were dissected out and divided between small and medium ligaclips with the Harmonic scalpel. Superior parathyroid was identified and preserved. Inferior venous tributaries were divided between medium ligaclips with the harmonic scalpel. The right thyroid lobe was rolled  anteriorly and the branches  of the inferior thyroid artery divided between small ligaclips. The right recurrent laryngeal nerve was identified and preserved along its course. The ligament of Gwenlyn Found was released with the electrocautery. The right thyroid lobe was mobilized onto the anterior trachea and the remainder of the thyroid was dissected off the anterior trachea and the thyroid was completely excised. A single suture was used to mark the right lobe, and a double suture was used to mark the left lobe. The entire thyroid gland was submitted to pathology for review.  The neck was irrigated with warm saline. Fibrillar was placed throughout the operative field. Strap muscles were approximated in the midline with interrupted 3-0 Vicryl sutures. Platysma was closed with interrupted 3-0 Vicryl sutures. Skin was closed with a running 4-0 Monocryl subcuticular suture. Wound was washed and Dermabond was applied. The patient was awakened from anesthesia and brought to the recovery room. The patient tolerated the procedure well.   Armandina Gemma, MD [facility] Office: [phone]

## 2018-12-15 NOTE — Interval H&P Note (Signed)
History and Physical Interval Note:  12/15/2018 3:20 PM  Amy Nelson  has presented today for surgery, with the diagnosis of THYROID NEOPLASM OF UNCERTAIN BEHAVIOR.  The various methods of treatment have been discussed with the patient and family. After consideration of risks, benefits and other options for treatment, the patient has consented to    Procedure(s): TOTAL THYROIDECTOMY (N/A) as a surgical intervention.    The patient's history has been reviewed, patient examined, no change in status, stable for surgery.  I have reviewed the patient's chart and labs.  Questions were answered to the patient's satisfaction.    Armandina Gemma, [place] [facility] Office: [place]

## 2018-12-15 NOTE — Discharge Instructions (Signed)
[facility]  THYROID & PARATHYROID SURGERY:  POST-OP INSTRUCTIONS  Always review your discharge instruction sheet from the facility where your surgery was performed.  A prescription for pain medication may be given to you upon discharge.  Take your pain medication as prescribed.  If narcotic pain medicine is not needed, then you may take acetaminophen (Tylenol) or ibuprofen (Advil) as needed.  Take your usually prescribed medications unless otherwise directed.  If you need a refill on your pain medication, please contact our office during regular business hours.  Prescriptions cannot be processed by our office after 5 pm or on weekends.  Start with a light diet upon arrival home, such as soup and crackers or toast.  Be sure to drink plenty of fluids daily.  Resume your normal diet the day after surgery.  Most patients will experience some swelling and bruising on the chest and neck area.  Ice packs will help.  Swelling and bruising can take several days to resolve.   It is common to experience some constipation after surgery.  Increasing fluid intake and taking a stool softener (Colace) will usually help or prevent this problem.  A mild laxative (Milk of Magnesia or Miralax) should be taken according to package directions if there has been no bowel movement after 48 hours.  You have steri-strips and a gauze dressing over your incision.  You may remove the gauze bandage on the second day after surgery, and you may shower at that time.  Leave your steri-strips (small skin tapes) in place directly over the incision.  These strips should remain on the skin for 5-7 days and then be removed.  You may get them wet in the shower and pat them dry.  You may resume regular (light) daily activities beginning the next day (such as daily self-care, walking, climbing stairs) gradually increasing activities as tolerated.  You may have sexual intercourse when it is comfortable.  Refrain from  any heavy lifting or straining until approved by your doctor.  You may drive when you no longer are taking prescription pain medication, you can comfortably wear a seatbelt, and you can safely maneuver your car and apply brakes.  You should see your doctor in the office for a follow-up appointment approximately three weeks after your surgery.  Make sure that you call for this appointment within a day or two after you arrive home to insure a convenient appointment time.  WHEN TO CALL YOUR DOCTOR: -- Fever greater than 101.5 -- Inability to urinate -- Nausea and/or vomiting - persistent -- Extreme swelling or bruising -- Continued bleeding from incision -- Increased pain, redness, or drainage from the incision -- Difficulty swallowing or breathing -- Muscle cramping or spasms -- Numbness or tingling in hands or around lips  The clinic staff is available to answer your questions during regular business hours.  Please don't hesitate to call and ask to speak to one of the nurses if you have concerns.  Hajime Asfaw, MD [facility] Office: [phone] 

## 2018-12-15 NOTE — Progress Notes (Signed)
Pre-op 93min monitoring completed.  Tracing shown to Dr Rosana Hoes who is satisfied with fetal heart rate.  RROB to return to PACU and monitor post op

## 2018-12-15 NOTE — Anesthesia Preprocedure Evaluation (Addendum)
Anesthesia Evaluation  Patient identified by MRN, date of birth, ID band Patient awake    Reviewed: Allergy & Precautions, H&P , NPO status , Patient's Chart, lab work & pertinent test results  Airway Mallampati: III  TM Distance: >3 FB Neck ROM: Full    Dental no notable dental hx. (+) Chipped, Dental Advisory Given   Pulmonary asthma ,    Pulmonary exam normal breath sounds clear to auscultation       Cardiovascular negative cardio ROS Normal cardiovascular exam Rhythm:Regular Rate:Normal     Neuro/Psych negative neurological ROS  negative psych ROS   GI/Hepatic negative GI ROS, Neg liver ROS,   Endo/Other  diabetes, GestationalEnlarged thyroid with rapid growth- some compressive symptoms. FNA indeterminate  Has experienced symptoms especially when lying down of shortness of breath and pressure.  She often awakens at night short of breath and gasping for air.  She has had some minor dysphagia.  No other imaging of the neck aside from ultrasound  Obesity BMI 35  Renal/GU negative Renal ROS  negative genitourinary   Musculoskeletal negative musculoskeletal ROS (+)   Abdominal Normal abdominal exam  (+)   Peds negative pediatric ROS (+)  Hematology negative hematology ROS (+)   Anesthesia Other Findings   Reproductive/Obstetrics negative OB ROS (+) Pregnancy 26 3/[redacted] weeks gestation, viable fetus 4th baby [facility]                        Anesthesia Physical Anesthesia Plan  ASA: III  Anesthesia Plan: General   Post-op Pain Management:    Induction: Intravenous, Cricoid pressure planned and Rapid sequence  PONV Risk Score and Plan: 3 and Treatment may vary due to age or medical condition, Ondansetron, Dexamethasone and Scopolamine patch - Pre-op  Airway Management Planned: Oral ETT and Video Laryngoscope Planned  Additional Equipment:  None  Intra-op Plan:   Post-operative Plan: Extubation in OR  Informed Consent: I have reviewed the patients History and Physical, chart, labs and discussed the procedure including the risks, benefits and alternatives for the proposed anesthesia with the patient or authorized representative who has indicated his/her understanding and acceptance.     Dental advisory given  Plan Discussed with: CRNA  Anesthesia Plan Comments: (D/w obstetrician on call at [facility], Dr. Nehemiah Settle, who stated that preop and postop FHTs are sufficient as opposed to continuous monitoring of fetus. In case of preterm labor or need for emergent intervention, he will be available for delivery of viable fetus.   Enlarged thyroid with compressive symptoms- will need RSI d/t 3rd trimester pregnancy, will have glidescope and fiberoptic available in room as well as multiple smaller sized ETTs if unable to pass standard size ETT.)        Anesthesia Quick Evaluation

## 2018-12-15 NOTE — Progress Notes (Signed)
Dr Rosana Hoes updated on surgery and pt current status.  Also notified that fhr is reassuring but has occasional variables.  Pt is to transport to [facility] specialty care for the night.  Dr Rosana Hoes  agrees with this plan of care and pt is removed from the monitor.

## 2018-12-15 NOTE — Progress Notes (Signed)
Pt episode of N/V with some blood in emesis reported to RN at 2314. Notified MD at 2330. Verbal orders received for pt to be NPO. Will reassess tomorrow.

## 2018-12-15 NOTE — Anesthesia Procedure Notes (Signed)
Procedure Name: Intubation Date/Time: 12/15/2018 3:40 PM Performed by: Oletta Lamas, CRNA Pre-anesthesia Checklist: Patient identified, Emergency Drugs available, Suction available and Patient being monitored Patient Re-evaluated:Patient Re-evaluated prior to induction Oxygen Delivery Method: Circle System Utilized Preoxygenation: Pre-oxygenation with 100% oxygen Induction Type: IV induction, Rapid sequence and Cricoid Pressure applied Laryngoscope Size: Glidescope and 3 Tube type: Oral Number of attempts: 1 Airway Equipment and Method: Stylet Placement Confirmation: ETT inserted through vocal cords under direct vision,  positive ETCO2 and breath sounds checked- equal and bilateral Secured at: 22 cm Tube secured with: Tape Dental Injury: Teeth and Oropharynx as per pre-operative assessment

## 2018-12-15 NOTE — Transfer of Care (Signed)
Immediate Anesthesia Transfer of Care Note  Patient: Amy Nelson  Procedure(s) Performed: TOTAL THYROIDECTOMY (N/A Throat)  Patient Location: PACU  Anesthesia Type:General  Level of Consciousness: awake, oriented, drowsy and patient cooperative  Airway & Oxygen Therapy: Patient Spontanous Breathing and Patient connected to nasal cannula oxygen  Post-op Assessment: Report given to RN and Post -op Vital signs reviewed and stable  Post vital signs: Reviewed and stable  Last Vitals:  Vitals Value Taken Time  BP 96/67 12/15/18 1751  Temp    Pulse 100 12/15/18 1753  Resp 24 12/15/18 1753  SpO2 91 % 12/15/18 1753  Vitals shown include unvalidated device data.  Last Pain:  Vitals:   12/15/18 1330  TempSrc:   PainSc: 0-No pain      Patients Stated Pain Goal: 4 ([id number])  Complications: No apparent anesthesia complications   No swelling at surgical site.  Phonation = to pre-op.  Denies sob, difficulty breathing, or any other discomforts.

## 2018-12-16 DIAGNOSIS — O26892 Other specified pregnancy related conditions, second trimester: Secondary | ICD-10-CM

## 2018-12-16 DIAGNOSIS — O99212 Obesity complicating pregnancy, second trimester: Secondary | ICD-10-CM | POA: Diagnosis not present

## 2018-12-16 DIAGNOSIS — O99512 Diseases of the respiratory system complicating pregnancy, second trimester: Secondary | ICD-10-CM | POA: Diagnosis not present

## 2018-12-16 DIAGNOSIS — J45909 Unspecified asthma, uncomplicated: Secondary | ICD-10-CM | POA: Diagnosis not present

## 2018-12-16 DIAGNOSIS — Z3A27 27 weeks gestation of pregnancy: Secondary | ICD-10-CM

## 2018-12-16 DIAGNOSIS — E041 Nontoxic single thyroid nodule: Secondary | ICD-10-CM | POA: Diagnosis not present

## 2018-12-16 DIAGNOSIS — E669 Obesity, unspecified: Secondary | ICD-10-CM | POA: Diagnosis not present

## 2018-12-16 DIAGNOSIS — Z3A24 24 weeks gestation of pregnancy: Secondary | ICD-10-CM | POA: Diagnosis not present

## 2018-12-16 DIAGNOSIS — D440 Neoplasm of uncertain behavior of thyroid gland: Secondary | ICD-10-CM

## 2018-12-16 DIAGNOSIS — O99282 Endocrine, nutritional and metabolic diseases complicating pregnancy, second trimester: Secondary | ICD-10-CM | POA: Diagnosis not present

## 2018-12-16 LAB — BASIC METABOLIC PANEL
Anion gap: 10 (ref 5–15)
BUN: <5 mg/dL — ABNORMAL LOW (ref 6–20)
CO2: 22 mmol/L (ref 22–32)
Calcium: 8.7 mg/dL — ABNORMAL LOW (ref 8.9–10.3)
Chloride: 101 mmol/L (ref 98–111)
Creatinine, Ser: 0.39 mg/dL — ABNORMAL LOW (ref 0.44–1.00)
GFR calc Af Amer: >60 mL/min (ref >60)
GFR calc non Af Amer: >60 mL/min (ref >60)
Glucose, Bld: 128 mg/dL — ABNORMAL HIGH (ref 70–99)
Potassium: 3.8 mmol/L (ref 3.5–5.1)
Sodium: 133 mmol/L — ABNORMAL LOW (ref 135–145)

## 2018-12-16 MED ORDER — CALCIUM CARBONATE 1250 (500 CA) MG PO TABS: 2.0000 | ORAL_TABLET | Freq: Three times a day (TID) | ORAL | 0 refills | Status: DC

## 2018-12-16 MED ORDER — LEVOTHYROXINE SODIUM 100 MCG PO TABS: 100.0000 ug | ORAL_TABLET | Freq: Every day | ORAL | 0 refills | Status: DC

## 2018-12-16 MED ORDER — HYDROCODONE-ACETAMINOPHEN 5-325 MG PO TABS: 1.0000 | ORAL_TABLET | ORAL | 0 refills | Status: DC | PRN

## 2018-12-16 NOTE — Progress Notes (Signed)
1 Day Post-Op   Subjective/Chief Complaint: Had some nausea last night.  This is resolved.  Has not yet eaten/drunk anything this AM. Denies tingling or muscle spasms.      Objective: Vital signs in last 24 hours: Temp:  [98 F (36.7 C)-99.6 F (37.6 C)] 98 F (36.7 C) (11/26 0626) Pulse Rate:  [90-115] 90 (11/26 0626) Resp:  [17-26] 18 (11/26 0745) BP: (96-124)/(51-73) 100/51 (11/26 0626) SpO2:  [90 %-99 %] 97 % (11/26 0626) Weight:  [86.7 kg] 86.7 kg (11/25 1319)    Intake/Output from previous day: 11/25 0701 - 11/26 0700 In: 1232.8 [I.V.:1232.8] Out: 1500 [Urine:950; Emesis/NG output:250; Blood:300] Intake/Output this shift: No intake/output data recorded.  General appearance: alert, cooperative and no distress Neck: incision c/d/i.  minimal swelling.  neck soft.  voice sl hoarse, but able to articulate well Resp: breathing comfortably  Lab Results:  Recent Labs    12/14/18 1021  WBC 10.0  HGB 11.5*  HCT 35.7*  PLT 257   BMET Recent Labs    12/16/18 0534  NA 133*  K 3.8  CL 101  CO2 22  GLUCOSE 128*  BUN <5*  CREATININE 0.39*  CALCIUM 8.7*   PT/INR No results for input(s): LABPROT, INR in the last 72 hours. ABG No results for input(s): PHART, HCO3 in the last 72 hours.  Invalid input(s): PCO2, PO2  Studies/Results: No results found.  Anti-infectives: Anti-infectives (From admission, onward)   Start     Dose/Rate Route Frequency Ordered Stop   12/16/18 0600  ceFAZolin (ANCEF) IVPB 2g/100 mL premix     2 g 200 mL/hr over 30 Minutes Intravenous On call to O.R. 12/15/18 1312 12/15/18 1552   12/15/18 1320  ceFAZolin (ANCEF) 2-4 GM/100ML-% IVPB    Note to Pharmacy: Laurita Quint   : cabinet override      12/15/18 1320 12/15/18 1552      Assessment/Plan: s/p Procedure(s): TOTAL THYROIDECTOMY (N/A) d/c later today if tolerating diet.   tums TID for risk of hypocalcemia Thyroid medication at home.    LOS: 1 day    Stark Klein 12/16/2018

## 2018-12-16 NOTE — Progress Notes (Signed)
Dr. Georgette Dover called to clarify if patient can take her po meds. MD changing her diet to clears

## 2018-12-16 NOTE — Progress Notes (Addendum)
FACULTY PRACTICE ANTEPARTUM PROGRESS NOTE  Amy Nelson is a 33 y.o. G6P1030 at [redacted]w[redacted]d who is admitted post op s/p total thyroidectomy.  Length of Stay:  1 Days. Admitted 12/15/2018  Subjective:  Patient reports normal fetal movement.  She denies uterine contractions, denies bleeding and leaking of fluid per vagina.  Vitals:  Blood pressure (!) 100/51, pulse 90, temperature 98 F (36.7 C), temperature source Oral, resp. rate (P) 18, height 5\' 2"  (1.575 m), weight 86.7 kg, SpO2 97 %, unknown if currently breastfeeding. Physical Examination: CONSTITUTIONAL: Well-developed, well-nourished female in no acute distress. Voice hoarse this am. HENT:  Normocephalic, atraumatic, Oropharynx is clear and moist EYES: Conjunctivae and EOM are normal. Pupils are equal, round, and reactive to light. No scleral icterus.  SKIN: Skin is warm and dry. No rash noted. Not diaphoretic. No erythema. No pallor. [place]: Alert and oriented to person, place, and time. PSYCHIATRIC: Normal mood and affect. Normal behavior. Normal judgment and thought content. CARDIOVASCULAR: Normal heart rate noted RESPIRATORY: Effort normal, no problems with respiration noted MUSCULOSKELETAL: Normal range of motion. No edema and no tenderness. ABDOMEN: Soft, nontender, nondistended, gravid. CERVIX: deferred  Last pm, not yet done today Fetal monitoring: FHR: 135 bpm, Variability: moderate, Accelerations: Present, Decelerations: occasional variable  Uterine activity: 1 contractions per hour  Results for orders placed or performed during the hospital encounter of 12/15/18 (from the past 48 hour(s))  Glucose, capillary     Status: None   Collection Time: 12/15/18  1:30 PM  Result Value Ref Range   Glucose-Capillary 70 70 - 99 mg/dL  Basic metabolic panel     Status: Abnormal   Collection Time: 12/16/18  5:34 AM  Result Value Ref Range   Sodium 133 (L) 135 - 145 mmol/L   Potassium 3.8 3.5 - 5.1 mmol/L   Chloride 101 98 -  111 mmol/L   CO2 22 22 - 32 mmol/L   Glucose, Bld 128 (H) 70 - 99 mg/dL   BUN <5 (L) 6 - 20 mg/dL   Creatinine, Ser 0.39 (L) 0.44 - 1.00 mg/dL   Calcium 8.7 (L) 8.9 - 10.3 mg/dL   GFR calc non Af Amer >60 >60 mL/min   GFR calc Af Amer >60 >60 mL/min   Anion gap 10 5 - 15    Comment: Performed at [facility], [address], [place], [place] [address]    ASSESSMENT: Principal Problem:   Neoplasm of uncertain behavior of thyroid gland Active Problems:   Enlarged thyroid   S/P thyroidectomy   PLAN: POD#1 s/p thyroidectomy by general surgery Fetal monitoring as ordered Management per general surgery   Continue routine antenatal care.   Feliz Beam, M.D. [facility]ending [facility] [employer] (Faculty Practice)  12/16/2018 7:48 AM

## 2018-12-16 NOTE — Progress Notes (Signed)
1430- Discharge instructions given to patient, medication regimen and follow-up appoints discussed and patient verbalizes understanding. 1500- Dr. Harlow Asa called to request prescriptions be transferred to [facility] because patients listed pharmacy is closed for the holiday. 1530- Patient prefers to leave and not wait on her new AVS with new pharmacy listed.

## 2018-12-17 ENCOUNTER — Encounter (HOSPITAL_COMMUNITY): Payer: Self-pay | Admitting: Surgery

## 2018-12-18 NOTE — Anesthesia Postprocedure Evaluation (Signed)
Anesthesia Post Note  Patient: Amy Nelson  Procedure(s) Performed: TOTAL THYROIDECTOMY (N/A Throat)     Patient location during evaluation: PACU Anesthesia Type: General Level of consciousness: awake and alert Pain management: pain level controlled Vital Signs Assessment: post-procedure vital signs reviewed and stable Respiratory status: spontaneous breathing, nonlabored ventilation and respiratory function stable Cardiovascular status: blood pressure returned to baseline and stable Postop Assessment: no apparent nausea or vomiting Anesthetic complications: no    Last Vitals:  Vitals:   12/16/18 1100 12/16/18 1201  BP:  102/62  Pulse:  (!) 107  Resp: 20 20  Temp:  37.3 C  SpO2:  99%    Last Pain:  Vitals:   12/16/18 1525  TempSrc:   PainSc: 6                  Horst Ostermiller,W. EDMOND

## 2018-12-23 ENCOUNTER — Encounter: Payer: Self-pay | Admitting: "Endocrinology

## 2018-12-23 ENCOUNTER — Other Ambulatory Visit: Payer: Self-pay

## 2018-12-23 ENCOUNTER — Ambulatory Visit (INDEPENDENT_AMBULATORY_CARE_PROVIDER_SITE_OTHER): Payer: BC Managed Care – PPO | Admitting: "Endocrinology

## 2018-12-23 DIAGNOSIS — E890 Postprocedural hypothyroidism: Secondary | ICD-10-CM

## 2018-12-23 DIAGNOSIS — C73 Malignant neoplasm of thyroid gland: Secondary | ICD-10-CM

## 2018-12-23 MED ORDER — LEVOTHYROXINE SODIUM 125 MCG PO TABS: 125.0000 ug | ORAL_TABLET | Freq: Every day | ORAL | 2 refills | Status: DC

## 2018-12-23 NOTE — Progress Notes (Signed)
12/23/2018, 2:24 PM                                    Endocrinology Telehealth Visit Follow up Note -During COVID -19 Pandemic  I connected with Amy Nelson on 12/23/2018   by telephone and verified that I am speaking with the correct person using two identifiers. Amy Nelson, 1985/07/03. she has verbally consented to this visit. All issues noted in this document were discussed and addressed. The format was not optimal for physical exam.  Subjective:    Patient ID: Amy Nelson, female    DOB: 05/01/1985, PCP [facility] Personal   Past Medical History:  Diagnosis Date  . Asthma   . Diabetes mellitus without complication ([place])    Gestational    Past Surgical History:  Procedure Laterality Date  . BREAST REDUCTION SURGERY    . CESAREAN SECTION    . THYROIDECTOMY N/A 12/15/2018   Procedure: TOTAL THYROIDECTOMY;  Surgeon: Armandina Gemma, MD;  Location: [facility] OR;  Service: General;  Laterality: N/A;   Social History   Socioeconomic History  . Marital status: Single    Spouse name: Not on file  . Number of children: Not on file  . Years of education: Not on file  . Highest education level: Not on file  Occupational History  . Not on file  Social Needs  . Financial resource strain: Not on file  . Food insecurity    Worry: Not on file    Inability: Not on file  . Transportation needs    Medical: Not on file    Non-medical: Not on file  Tobacco Use  . Smoking status: Never Smoker  . Smokeless tobacco: Never Used  Substance and Sexual Activity  . Alcohol use: No  . Drug use: No  . Sexual activity: Yes    Birth control/protection: None  Lifestyle  . Physical activity    Days per week: Not on file    Minutes per session: Not on file  . Stress: Not on file  Relationships  . Social Herbalist on phone: Not on file    Gets together: Not on file    Attends religious service: Not  on file    Active member of club or organization: Not on file    Attends meetings of clubs or organizations: Not on file    Relationship status: Not on file  Other Topics Concern  . Not on file  Social History Narrative  . Not on file   Family History  Problem Relation Age of Onset  . Diabetes Mother   . Hypertension Mother   . Hypertension Father   . Hyperlipidemia Father    Outpatient Encounter Medications as of 12/23/2018  Medication Sig  . calcium carbonate (OS-CAL - DOSED IN MG OF ELEMENTAL CALCIUM) 1250 (500 Ca) MG tablet Take 2 tablets (1,000 mg of elemental calcium total) by mouth 3 (three) times daily with meals.  Marland Kitchen HYDROcodone-acetaminophen (NORCO/VICODIN) 5-325 MG tablet Take 1-2 tablets by mouth every 4 (four) hours as needed for moderate pain.  Marland Kitchen levothyroxine (SYNTHROID)  125 MCG tablet Take 1 tablet (125 mcg total) by mouth daily before breakfast.  . Prenatal Vit-Fe Fumarate-FA (PRENATAL MULTIVITAMIN) 60-1 MG tablet Take 1 tablet by mouth daily.    . [DISCONTINUED] levothyroxine (SYNTHROID) 100 MCG tablet Take 1 tablet (100 mcg total) by mouth daily.   No facility-administered encounter medications on file as of 12/23/2018.    ALLERGIES: No Known Allergies  VACCINATION STATUS:  There is no immunization history on file for this patient.  HPI Amy Nelson is 33 y.o. female who is being engaged in telehealth via telephone after she was seen in consultation for nodular goiter.  She is currently pregnant at [redacted] weeks of gestation.    She underwent fine-needle aspiration of an nodular goiter on November 04, 2018.  Cytology report confirmed atypia of undetermined significance.  Subsequent molecular studies confirm approximately 50% risk of malignancy.   She was sent for thyroidectomy during her second trimester pregnancy. She underwent total thyroidectomy on December 15, 2018.  Her surgical sample showed follicular neoplasm, pending outside consultation. "An amended  report will be issued once the outside consultation is completed". She has noticed goiter for several years, recently increasing in size.  She had recent normal thyroid function test.  Her ultrasound on September 10, 2018 showed 7.7 cm nodule on the left lobe which measures 8.5cm reportedly increasing in size compared to another study in February 2018.  -  She is recovering from her surgery.  She denies  dysphagia, shortness of breath, nor voice change.   She denies any family history of thyroid malignancy, however her mother has hypothyroidism on thyroid hormone supplement. She denies exposure to neck radiation. -She was discharged on levothyroxine 100 mcg p.o. daily before breakfast, reports compliance.  She reports some fatigue.     Review of Systems Limited as above.  Objective:    There were no vitals taken for this visit.  Wt Readings from Last 3 Encounters:  12/15/18 191 lb 2.2 oz (86.7 kg)  12/14/18 191 lb 3 oz (86.7 kg)  09/29/18 188 lb (85.3 kg)    Physical Exam   CMP ( most recent) CMP     Component Value Date/Time   NA 133 (L) 12/16/2018 0534   K 3.8 12/16/2018 0534   CL 101 12/16/2018 0534   CO2 22 12/16/2018 0534   GLUCOSE 128 (H) 12/16/2018 0534   BUN <5 (L) 12/16/2018 0534   CREATININE 0.39 (L) 12/16/2018 0534   CALCIUM 8.7 (L) 12/16/2018 0534   GFRNONAA >60 12/16/2018 0534   GFRAA >60 12/16/2018 0534     Summary of her thyroid ultrasound from September 10, 2018: Right lobe 5.1 cm x 1.2 cm x 1.3 cm-no nodules. Left lobe 8.5 mm x 5.3 cm x 4.4 cm with 7.7 cm nodule observed to increase in size compared to prior study from February 2018.  Thyroid fine-needle aspiration cytology: Atypia of undetermined significance.  The smears are cellular with sheets of follicular cells, follicles and colloid.  There are scattered microfollicular aggregates present.  No papillary architecture or Hurthle cell change.  Afirma 50% risk of Malignancy   Total thyroidectomy on  December 15, 2018 FINAL MICROSCOPIC DIAGNOSIS: A. THYROID, TOTAL THYROIDECTOMY: - Follicular neoplasm, pending outside consultation - See comment COMMENT: An amended report will be issued once the outisde consultation is completed.  Assessment & Plan:    1.  Follicular neoplasm, final microscopic diagnosis pending 2.  Nodular goiter -resolved 3.  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 For postsurgical hypothyroidism, she will be considered for higher dose of levothyroxine. I discussed and increase her levothyroxine to 125 mcg p.o. daily before breakfast.  - We discussed about the correct intake of her thyroid hormone, on empty stomach at fasting, with water, separated by at least 30 minutes from breakfast and other medications,  and separated by more than 4 hours from calcium, iron, multivitamins, acid reflux medications (PPIs). -Patient is made aware of the fact that thyroid hormone replacement is needed for life, dose to be adjusted by periodic monitoring of thyroid function tests.  She will have thyroid function tests early next week, and will be on a follow-up visit 10 days from now to discuss her surgical pathology and thyroid function test.  - I advised her  to maintain close follow up with [facility] [place] for primary care needs, as well as her OB/GYN given her pregnancy.    Time for this visit: 15 minutes. Alan D Warrell  participated in the discussions, expressed understanding, and voiced agreement with the above plans.  All questions were answered to her satisfaction. she is encouraged to contact clinic should she have any questions or concerns prior to her return visit.    Follow up plan: Return in about 10 days (around 01/02/2019), or she goes to [facility] for labs, for Follow up with Pre-visit  Labs.   Glade Lloyd, MD [facility] [address] [place], [place] [address] Phone: [phone]  Fax: [phone]     12/23/2018, 2:24 PM  This note was partially dictated with voice recognition software. Similar sounding words can be transcribed inadequately or may not  be corrected upon review.

## 2018-12-23 NOTE — Discharge Summary (Signed)
    Physician Discharge Summary [facility]  Patient ID: Amy Nelson MRN: [id number] DOB/AGE: [birth year] 33 y.o.  Admit date: 12/15/2018 Discharge date: 12/16/2018   Admission Diagnoses:  Thyroid neoplasm of uncertain behavior, enlarged thyroid  Discharge Diagnoses:  Principal Problem:   Neoplasm of uncertain behavior of thyroid gland Active Problems:   Enlarged thyroid   S/P thyroidectomy   Discharged Condition: good  Hospital Course: Patient was admitted for observation following thyroid surgery.  Post op course was uncomplicated.  Pain was well controlled.  Tolerated diet.  Post op calcium level on morning following surgery was 8.7 mg/dl.  Patient was prepared for discharge home on POD#1.  Consults: obstetrics/neonatal service  Treatments: surgery: total thyroidectomy  Discharge Exam: Blood pressure 102/62, pulse (!) 107, temperature 99.1 F (37.3 C), temperature source Oral, resp. rate 20, height 5\' 2"  (1.575 m), weight 86.7 kg, SpO2 99 %, unknown if currently breastfeeding. See progress notes.   Disposition: Home  Discharge Instructions    Call MD for:  difficulty breathing, headache or visual disturbances   Complete by: As directed    Call MD for:  persistant nausea and vomiting   Complete by: As directed    Call MD for:  redness, tenderness, or signs of infection (pain, swelling, redness, odor or green/yellow discharge around incision site)   Complete by: As directed    Call MD for:  severe uncontrolled pain   Complete by: As directed    Call MD for:  temperature >100.4   Complete by: As directed    Diet - low sodium heart healthy   Complete by: As directed    Increase activity slowly   Complete by: As directed      Allergies as of 12/16/2018   No Known Allergies     Medication List    TAKE these medications   calcium carbonate 1250 (500 Ca) MG tablet Commonly known as: OS-CAL - dosed in mg of elemental calcium Take 2 tablets  (1,000 mg of elemental calcium total) by mouth 3 (three) times daily with meals.   HYDROcodone-acetaminophen 5-325 MG tablet Commonly known as: NORCO/VICODIN Take 1-2 tablets by mouth every 4 (four) hours as needed for moderate pain.   levothyroxine 100 MCG tablet Commonly known as: Synthroid Take 1 tablet (100 mcg total) by mouth daily.   prenatal multivitamin 60-1 MG tablet Take 1 tablet by mouth daily.      Follow-up Information    Armandina Gemma, MD. Schedule an appointment as soon as possible for a visit in 3 weeks.   Specialty: General Surgery Why: For wound re-check Contact information: [address] [place] [address] [phone]        Cassandria Anger, MD. Schedule an appointment as soon as possible for a visit in 3 weeks.   Specialty: Endocrinology Contact information: [place] [address] [id number]           Earnstine Regal, MD, [facility] Office: [phone]   Signed: Armandina Gemma 12/23/2018, 10:41 AM

## 2018-12-23 NOTE — Patient Instructions (Signed)

## 2018-12-29 ENCOUNTER — Ambulatory Visit: Payer: BC Managed Care – PPO | Admitting: "Endocrinology

## 2018-12-30 DIAGNOSIS — D440 Neoplasm of uncertain behavior of thyroid gland: Secondary | ICD-10-CM | POA: Diagnosis not present

## 2018-12-30 DIAGNOSIS — E890 Postprocedural hypothyroidism: Secondary | ICD-10-CM | POA: Diagnosis not present

## 2018-12-30 DIAGNOSIS — E049 Nontoxic goiter, unspecified: Secondary | ICD-10-CM | POA: Diagnosis not present

## 2018-12-31 DIAGNOSIS — O24419 Gestational diabetes mellitus in pregnancy, unspecified control: Secondary | ICD-10-CM | POA: Diagnosis not present

## 2019-01-01 LAB — TSH: TSH: 11.4 — AB (ref 0.41–5.90)

## 2019-01-03 ENCOUNTER — Encounter (HOSPITAL_COMMUNITY): Payer: Self-pay

## 2019-01-06 ENCOUNTER — Encounter: Payer: Self-pay | Admitting: "Endocrinology

## 2019-01-06 ENCOUNTER — Ambulatory Visit (INDEPENDENT_AMBULATORY_CARE_PROVIDER_SITE_OTHER): Payer: BC Managed Care – PPO | Admitting: "Endocrinology

## 2019-01-06 DIAGNOSIS — O24419 Gestational diabetes mellitus in pregnancy, unspecified control: Secondary | ICD-10-CM | POA: Diagnosis not present

## 2019-01-06 DIAGNOSIS — O24410 Gestational diabetes mellitus in pregnancy, diet controlled: Secondary | ICD-10-CM | POA: Diagnosis not present

## 2019-01-06 DIAGNOSIS — E041 Nontoxic single thyroid nodule: Secondary | ICD-10-CM | POA: Diagnosis not present

## 2019-01-06 DIAGNOSIS — C73 Malignant neoplasm of thyroid gland: Secondary | ICD-10-CM

## 2019-01-06 DIAGNOSIS — E890 Postprocedural hypothyroidism: Secondary | ICD-10-CM

## 2019-01-06 MED ORDER — LEVOTHYROXINE SODIUM 137 MCG PO TABS: 137.0000 ug | ORAL_TABLET | Freq: Every day | ORAL | 3 refills | Status: DC

## 2019-01-06 NOTE — Progress Notes (Signed)
01/06/2019, 4:38 PM                                    Endocrinology Telehealth Visit Follow up Note -During COVID -19 Pandemic  I connected with Amy Nelson on 01/06/2019   by telephone and verified that I am speaking with the correct person using two identifiers. Amy Nelson, 1985-03-20. she has verbally consented to this visit. All issues noted in this document were discussed and addressed. The format was not optimal for physical exam.  Subjective:    Patient ID: Amy Nelson, female    DOB: 1985/12/28, PCP Health, [facility] Dept Personal   Past Medical History:  Diagnosis Date  . Asthma   . Diabetes mellitus without complication ([place])    Gestational    Past Surgical History:  Procedure Laterality Date  . BREAST REDUCTION SURGERY    . CESAREAN SECTION    . THYROIDECTOMY N/A 12/15/2018   Procedure: TOTAL THYROIDECTOMY;  Surgeon: Armandina Gemma, MD;  Location: [facility] OR;  Service: General;  Laterality: N/A;   Social History   Socioeconomic History  . Marital status: Single    Spouse name: Not on file  . Number of children: Not on file  . Years of education: Not on file  . Highest education level: Not on file  Occupational History  . Not on file  Tobacco Use  . Smoking status: Never Smoker  . Smokeless tobacco: Never Used  Substance and Sexual Activity  . Alcohol use: No  . Drug use: No  . Sexual activity: Yes    Birth control/protection: None  Other Topics Concern  . Not on file  Social History Narrative  . Not on file   Social Determinants of Health   Financial Resource Strain:   . Difficulty of Paying Living Expenses: Not on file  Food Insecurity:   . Worried About Charity fundraiser in the Last Year: Not on file  . Ran Out of Food in the Last Year: Not on file  Transportation Needs:   . Lack of Transportation (Medical): Not on file  . Lack of Transportation (Non-Medical): Not on file   Physical Activity:   . Days of Exercise per Week: Not on file  . Minutes of Exercise per Session: Not on file  Stress:   . Feeling of Stress : Not on file  Social Connections:   . Frequency of Communication with Friends and Family: Not on file  . Frequency of Social Gatherings with Friends and Family: Not on file  . Attends Religious Services: Not on file  . Active Member of Clubs or Organizations: Not on file  . Attends Archivist Meetings: Not on file  . Marital Status: Not on file   Family History  Problem Relation Age of Onset  . Diabetes Mother   . Hypertension Mother   . Hypertension Father   . Hyperlipidemia Father    Outpatient Encounter Medications as of 01/06/2019  Medication Sig  . calcium carbonate (OS-CAL - DOSED IN MG OF ELEMENTAL CALCIUM) 1250 (500 Ca) MG tablet Take 2 tablets (1,000 mg of  elemental calcium total) by mouth 3 (three) times daily with meals.  Marland Kitchen HYDROcodone-acetaminophen (NORCO/VICODIN) 5-325 MG tablet Take 1-2 tablets by mouth every 4 (four) hours as needed for moderate pain.  Marland Kitchen levothyroxine (SYNTHROID) 137 MCG tablet Take 1 tablet (137 mcg total) by mouth daily before breakfast.  . Prenatal Vit-Fe Fumarate-FA (PRENATAL MULTIVITAMIN) 60-1 MG tablet Take 1 tablet by mouth daily.    . [DISCONTINUED] levothyroxine (SYNTHROID) 125 MCG tablet Take 1 tablet (125 mcg total) by mouth daily before breakfast.   No facility-administered encounter medications on file as of 01/06/2019.   ALLERGIES: No Known Allergies  VACCINATION STATUS:  There is no immunization history on file for this patient.  HPI Amy Nelson is 33 y.o. 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   She is currently pregnant at [redacted] weeks of gestation.    She underwent fine-needle aspiration of an nodular goiter on November 04, 2018.  Cytology report confirmed atypia of  undetermined significance.  Subsequent molecular studies confirm approximately 50% risk of malignancy.   She was sent for thyroidectomy during her second trimester pregnancy. She underwent total thyroidectomy on December 15, 2018.  Her surgical sample showed follicular neoplasm.  Outside consultation to Dr. Holly Nelson of [facility] reviewed the slides and summary was 7.7 cm left lobe mass: Grossly encapsulated, angioinvasive, follicular carcinoma with at least 2 foci of vascular invasion in tumor capsule; tumor confined to the thyroid.    She is recovering from her surgery very well.    She is now currently on levothyroxine 125 mcg p.o. daily before breakfast with reasonable compliance.  Her previsit labs show still above target TSH, total T4 10.    She denies  dysphagia, shortness of breath, nor voice change.   She denies any family history of thyroid malignancy, however her mother has hypothyroidism on thyroid hormone supplement. She denies exposure to neck radiation.   Review of Systems Limited as above.  Objective:    There were no vitals taken for this visit.  Wt Readings from Last 3 Encounters:  12/15/18 191 lb 2.2 oz (86.7 kg)  12/14/18 191 lb 3 oz (86.7 kg)  09/29/18 188 lb (85.3 kg)    Physical Exam   CMP ( most recent) CMP     Component Value Date/Time   NA 133 (L) 12/16/2018 0534   K 3.8 12/16/2018 0534   CL 101 12/16/2018 0534   CO2 22 12/16/2018 0534   GLUCOSE 128 (H) 12/16/2018 0534   BUN <5 (L) 12/16/2018 0534   CREATININE 0.39 (L) 12/16/2018 0534   CALCIUM 8.7 (L) 12/16/2018 0534   GFRNONAA >60 12/16/2018 0534   GFRAA >60 12/16/2018 0534     Summary of her thyroid ultrasound from September 10, 2018: Right lobe 5.1 cm x 1.2 cm x 1.3 cm-no nodules. Left lobe 8.5 mm x 5.3 cm x 4.4 cm with 7.7 cm nodule observed to increase in size compared to prior study from February 2018.  Thyroid fine-needle aspiration cytology: Atypia of undetermined  significance.  The smears are cellular with sheets of follicular cells, follicles and colloid.  There are scattered microfollicular aggregates present.  No papillary architecture or Hurthle cell change.  Afirma 50% risk of Malignancy   Total thyroidectomy on December 15, 2018 FINAL MICROSCOPIC DIAGNOSIS: A. THYROID, TOTAL THYROIDECTOMY: - Follicular neoplasm, pending outside consultation - See comment COMMENT: Slides were sent for outside consultation. Outside consultation  to Dr. Holly Nelson of [facility] reviewed the slides and summary was 7.7 cm left lobe mass: Grossly encapsulated, angioinvasive, follicular carcinoma with at least 2 foci of vascular invasion in tumor capsule; tumor confined to the thyroid.     Assessment & Plan:    1.  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  Nodular goiter -resolved 3.  Pregnant-30 weeks 4-postsurgical hypothyroidism  -She is status post total thyroidectomy, follicular carcinoma confirmed on left lobe.  After her delivery, she will be considered for I-131 thyroid remnant ablation. She is not planning breast-feeding. For postsurgical hypothyroidism, she will be considered for higher dose of levothyroxine. I discussed and increased her levothyroxine to 137 mcg p.o. daily before breakfast.     - We discussed about the correct intake of her thyroid hormone, on empty stomach at fasting, with water, separated by at least 30 minutes from breakfast and other medications,  and separated by more than 4 hours from calcium, iron, multivitamins, acid reflux medications (PPIs). -Patient is made aware of the fact that thyroid hormone replacement is needed for life, dose to be adjusted by periodic monitoring of thyroid function tests.  - I advised her  to maintain close follow up with Health, [facility] Personal for primary care needs, as well as her OB/GYN given her pregnancy.  - Patient Care Time Today:  25 min, of which >50% was  spent in  counseling and the rest reviewing her surgical pathology results, previous imaging studies,and medications' doses and developing a plan for long-term care based on the latest recommendations for standards of care.   Amy Nelson participated in the discussions, expressed understanding, and voiced agreement with the above plans.  All questions were answered to her satisfaction. she is encouraged to contact clinic should she have any questions or concerns prior to her return visit.   Follow up plan: Return in about 10 weeks (around 03/17/2019) for Follow up with Pre-visit Labs.   Glade Lloyd, MD [facility] [address] [place], [place] [address] Phone: [phone]  Fax: [phone]     01/06/2019, 4:38 PM  This note was partially dictated with voice recognition software. Similar sounding words can be transcribed inadequately or may not  be corrected upon review.

## 2019-02-04 DIAGNOSIS — E041 Nontoxic single thyroid nodule: Secondary | ICD-10-CM | POA: Diagnosis not present

## 2019-02-04 DIAGNOSIS — D497 Neoplasm of unspecified behavior of endocrine glands and other parts of nervous system: Secondary | ICD-10-CM | POA: Diagnosis not present

## 2019-02-04 DIAGNOSIS — O0993 Supervision of high risk pregnancy, unspecified, third trimester: Secondary | ICD-10-CM | POA: Diagnosis not present

## 2019-02-04 DIAGNOSIS — R8290 Unspecified abnormal findings in urine: Secondary | ICD-10-CM | POA: Diagnosis not present

## 2019-02-11 DIAGNOSIS — O24414 Gestational diabetes mellitus in pregnancy, insulin controlled: Secondary | ICD-10-CM | POA: Diagnosis not present

## 2019-02-11 DIAGNOSIS — Z3A35 35 weeks gestation of pregnancy: Secondary | ICD-10-CM | POA: Diagnosis not present

## 2019-02-16 ENCOUNTER — Telehealth: Payer: Self-pay | Admitting: "Endocrinology

## 2019-02-16 DIAGNOSIS — O0993 Supervision of high risk pregnancy, unspecified, third trimester: Secondary | ICD-10-CM | POA: Diagnosis not present

## 2019-02-16 DIAGNOSIS — Z3A35 35 weeks gestation of pregnancy: Secondary | ICD-10-CM | POA: Diagnosis not present

## 2019-02-16 DIAGNOSIS — O24113 Pre-existing diabetes mellitus, type 2, in pregnancy, third trimester: Secondary | ICD-10-CM | POA: Diagnosis not present

## 2019-02-16 DIAGNOSIS — Z362 Encounter for other antenatal screening follow-up: Secondary | ICD-10-CM | POA: Diagnosis not present

## 2019-02-16 DIAGNOSIS — O99213 Obesity complicating pregnancy, third trimester: Secondary | ICD-10-CM | POA: Diagnosis not present

## 2019-02-16 DIAGNOSIS — O24414 Gestational diabetes mellitus in pregnancy, insulin controlled: Secondary | ICD-10-CM | POA: Diagnosis not present

## 2019-02-16 DIAGNOSIS — E119 Type 2 diabetes mellitus without complications: Secondary | ICD-10-CM | POA: Diagnosis not present

## 2019-02-16 NOTE — Telephone Encounter (Signed)
I do not see any reason to change her dose from 137 mcg for now. Advise her to stay on same dose.

## 2019-02-16 NOTE — Telephone Encounter (Signed)
Pt has labs in Care everywhere. Her OB GYN told her the synthroid dosage needed to be changed. However the lab is WNL

## 2019-02-16 NOTE — Telephone Encounter (Signed)
Pt notified by by VM

## 2019-02-21 DIAGNOSIS — Z3A37 37 weeks gestation of pregnancy: Secondary | ICD-10-CM | POA: Diagnosis not present

## 2019-02-21 DIAGNOSIS — G8929 Other chronic pain: Secondary | ICD-10-CM | POA: Diagnosis not present

## 2019-02-21 DIAGNOSIS — R519 Headache, unspecified: Secondary | ICD-10-CM | POA: Diagnosis not present

## 2019-02-21 DIAGNOSIS — Z1389 Encounter for screening for other disorder: Secondary | ICD-10-CM | POA: Diagnosis not present

## 2019-02-21 DIAGNOSIS — O24414 Gestational diabetes mellitus in pregnancy, insulin controlled: Secondary | ICD-10-CM | POA: Diagnosis not present

## 2019-03-07 DIAGNOSIS — Z01812 Encounter for preprocedural laboratory examination: Secondary | ICD-10-CM | POA: Diagnosis not present

## 2019-03-07 DIAGNOSIS — Z20822 Contact with and (suspected) exposure to covid-19: Secondary | ICD-10-CM | POA: Diagnosis not present

## 2019-03-07 DIAGNOSIS — Z3490 Encounter for supervision of normal pregnancy, unspecified, unspecified trimester: Secondary | ICD-10-CM | POA: Diagnosis not present

## 2019-03-09 DIAGNOSIS — R918 Other nonspecific abnormal finding of lung field: Secondary | ICD-10-CM | POA: Diagnosis not present

## 2019-03-09 DIAGNOSIS — E872 Acidosis: Secondary | ICD-10-CM | POA: Diagnosis not present

## 2019-03-09 DIAGNOSIS — I499 Cardiac arrhythmia, unspecified: Secondary | ICD-10-CM | POA: Diagnosis not present

## 2019-03-09 DIAGNOSIS — Z9911 Dependence on respirator [ventilator] status: Secondary | ICD-10-CM | POA: Diagnosis not present

## 2019-03-09 DIAGNOSIS — Z23 Encounter for immunization: Secondary | ICD-10-CM | POA: Diagnosis not present

## 2019-03-09 DIAGNOSIS — D62 Acute posthemorrhagic anemia: Secondary | ICD-10-CM | POA: Diagnosis not present

## 2019-03-09 DIAGNOSIS — Z452 Encounter for adjustment and management of vascular access device: Secondary | ICD-10-CM | POA: Diagnosis not present

## 2019-03-09 DIAGNOSIS — J45909 Unspecified asthma, uncomplicated: Secondary | ICD-10-CM | POA: Diagnosis not present

## 2019-03-09 DIAGNOSIS — A549 Gonococcal infection, unspecified: Secondary | ICD-10-CM | POA: Diagnosis not present

## 2019-03-09 DIAGNOSIS — Q828 Other specified congenital malformations of skin: Secondary | ICD-10-CM | POA: Diagnosis not present

## 2019-03-09 DIAGNOSIS — R0989 Other specified symptoms and signs involving the circulatory and respiratory systems: Secondary | ICD-10-CM | POA: Diagnosis not present

## 2019-03-09 DIAGNOSIS — R578 Other shock: Secondary | ICD-10-CM | POA: Diagnosis not present

## 2019-03-09 DIAGNOSIS — B009 Herpesviral infection, unspecified: Secondary | ICD-10-CM | POA: Diagnosis not present

## 2019-03-09 DIAGNOSIS — O9852 Other viral diseases complicating childbirth: Secondary | ICD-10-CM | POA: Diagnosis not present

## 2019-03-09 DIAGNOSIS — E890 Postprocedural hypothyroidism: Secondary | ICD-10-CM | POA: Diagnosis not present

## 2019-03-09 DIAGNOSIS — Z302 Encounter for sterilization: Secondary | ICD-10-CM | POA: Diagnosis not present

## 2019-03-09 DIAGNOSIS — R000 Tachycardia, unspecified: Secondary | ICD-10-CM | POA: Diagnosis not present

## 2019-03-09 DIAGNOSIS — O24414 Gestational diabetes mellitus in pregnancy, insulin controlled: Secondary | ICD-10-CM | POA: Diagnosis not present

## 2019-03-09 DIAGNOSIS — O34211 Maternal care for low transverse scar from previous cesarean delivery: Secondary | ICD-10-CM | POA: Diagnosis not present

## 2019-03-09 DIAGNOSIS — Q381 Ankyloglossia: Secondary | ICD-10-CM | POA: Diagnosis not present

## 2019-03-09 DIAGNOSIS — O99513 Diseases of the respiratory system complicating pregnancy, third trimester: Secondary | ICD-10-CM | POA: Diagnosis not present

## 2019-03-09 DIAGNOSIS — Z315 Encounter for genetic counseling: Secondary | ICD-10-CM | POA: Diagnosis not present

## 2019-03-09 DIAGNOSIS — I10 Essential (primary) hypertension: Secondary | ICD-10-CM | POA: Diagnosis not present

## 2019-03-09 DIAGNOSIS — O24424 Gestational diabetes mellitus in childbirth, insulin controlled: Secondary | ICD-10-CM | POA: Diagnosis not present

## 2019-03-09 DIAGNOSIS — Z3A39 39 weeks gestation of pregnancy: Secondary | ICD-10-CM | POA: Diagnosis not present

## 2019-03-09 DIAGNOSIS — O99284 Endocrine, nutritional and metabolic diseases complicating childbirth: Secondary | ICD-10-CM | POA: Diagnosis not present

## 2019-03-09 DIAGNOSIS — O9822 Gonorrhea complicating childbirth: Secondary | ICD-10-CM | POA: Diagnosis not present

## 2019-03-09 DIAGNOSIS — O99824 Streptococcus B carrier state complicating childbirth: Secondary | ICD-10-CM | POA: Diagnosis not present

## 2019-03-09 DIAGNOSIS — O9952 Diseases of the respiratory system complicating childbirth: Secondary | ICD-10-CM | POA: Diagnosis not present

## 2019-03-11 DIAGNOSIS — Q381 Ankyloglossia: Secondary | ICD-10-CM | POA: Diagnosis not present

## 2019-03-11 DIAGNOSIS — Q828 Other specified congenital malformations of skin: Secondary | ICD-10-CM | POA: Diagnosis not present

## 2019-03-11 DIAGNOSIS — R000 Tachycardia, unspecified: Secondary | ICD-10-CM | POA: Diagnosis not present

## 2019-03-11 DIAGNOSIS — Z315 Encounter for genetic counseling: Secondary | ICD-10-CM | POA: Diagnosis not present

## 2019-03-11 DIAGNOSIS — O34211 Maternal care for low transverse scar from previous cesarean delivery: Secondary | ICD-10-CM | POA: Diagnosis not present

## 2019-03-12 DIAGNOSIS — O34211 Maternal care for low transverse scar from previous cesarean delivery: Secondary | ICD-10-CM | POA: Diagnosis not present

## 2019-03-12 DIAGNOSIS — Q828 Other specified congenital malformations of skin: Secondary | ICD-10-CM | POA: Diagnosis not present

## 2019-03-12 DIAGNOSIS — Q381 Ankyloglossia: Secondary | ICD-10-CM | POA: Diagnosis not present

## 2019-03-18 ENCOUNTER — Ambulatory Visit: Payer: BC Managed Care – PPO | Admitting: "Endocrinology

## 2019-03-25 DIAGNOSIS — R109 Unspecified abdominal pain: Secondary | ICD-10-CM | POA: Diagnosis not present

## 2019-03-25 DIAGNOSIS — R188 Other ascites: Secondary | ICD-10-CM | POA: Diagnosis not present

## 2019-03-25 DIAGNOSIS — N99840 Postprocedural hematoma of a genitourinary system organ or structure following a genitourinary system procedure: Secondary | ICD-10-CM | POA: Diagnosis not present

## 2019-03-25 DIAGNOSIS — N3289 Other specified disorders of bladder: Secondary | ICD-10-CM | POA: Diagnosis not present

## 2019-03-25 DIAGNOSIS — G8918 Other acute postprocedural pain: Secondary | ICD-10-CM | POA: Diagnosis not present

## 2019-03-25 DIAGNOSIS — R509 Fever, unspecified: Secondary | ICD-10-CM | POA: Diagnosis not present

## 2019-03-25 DIAGNOSIS — R234 Changes in skin texture: Secondary | ICD-10-CM | POA: Diagnosis not present

## 2019-03-25 DIAGNOSIS — J45909 Unspecified asthma, uncomplicated: Secondary | ICD-10-CM | POA: Diagnosis not present

## 2019-03-25 DIAGNOSIS — M7989 Other specified soft tissue disorders: Secondary | ICD-10-CM | POA: Diagnosis not present

## 2019-03-25 DIAGNOSIS — Z20822 Contact with and (suspected) exposure to covid-19: Secondary | ICD-10-CM | POA: Diagnosis not present

## 2019-03-25 DIAGNOSIS — K6811 Postprocedural retroperitoneal abscess: Secondary | ICD-10-CM | POA: Diagnosis not present

## 2019-03-25 DIAGNOSIS — Y836 Removal of other organ (partial) (total) as the cause of abnormal reaction of the patient, or of later complication, without mention of misadventure at the time of the procedure: Secondary | ICD-10-CM | POA: Diagnosis not present

## 2019-03-25 DIAGNOSIS — R590 Localized enlarged lymph nodes: Secondary | ICD-10-CM | POA: Diagnosis not present

## 2019-03-25 DIAGNOSIS — Z90710 Acquired absence of both cervix and uterus: Secondary | ICD-10-CM | POA: Diagnosis not present

## 2019-03-25 DIAGNOSIS — E039 Hypothyroidism, unspecified: Secondary | ICD-10-CM | POA: Diagnosis not present

## 2019-03-25 DIAGNOSIS — Z87898 Personal history of other specified conditions: Secondary | ICD-10-CM | POA: Diagnosis not present

## 2019-03-25 DIAGNOSIS — M799 Soft tissue disorder, unspecified: Secondary | ICD-10-CM | POA: Diagnosis not present

## 2019-03-25 DIAGNOSIS — T8143XA Infection following a procedure, organ and space surgical site, initial encounter: Secondary | ICD-10-CM | POA: Diagnosis not present

## 2019-03-25 DIAGNOSIS — K6389 Other specified diseases of intestine: Secondary | ICD-10-CM | POA: Diagnosis not present

## 2019-03-25 DIAGNOSIS — R935 Abnormal findings on diagnostic imaging of other abdominal regions, including retroperitoneum: Secondary | ICD-10-CM | POA: Diagnosis not present

## 2019-03-26 DIAGNOSIS — R188 Other ascites: Secondary | ICD-10-CM | POA: Diagnosis not present

## 2019-03-26 DIAGNOSIS — N99840 Postprocedural hematoma of a genitourinary system organ or structure following a genitourinary system procedure: Secondary | ICD-10-CM | POA: Diagnosis not present

## 2019-04-06 LAB — TSH: TSH: 0.45 (ref 0.41–5.90)

## 2019-04-08 ENCOUNTER — Telehealth: Payer: Self-pay | Admitting: "Endocrinology

## 2019-04-08 NOTE — Telephone Encounter (Signed)
Patient is on schedule for Monday, she did labs at [facility]. Can you call and get results [phone]

## 2019-04-08 NOTE — Telephone Encounter (Signed)
Left a message with [facility] requesting they fax over pt's lab results.

## 2019-04-11 ENCOUNTER — Ambulatory Visit: Payer: Medicaid Other | Admitting: "Endocrinology

## 2019-04-11 NOTE — Telephone Encounter (Signed)
Pt said she did labs last Wednesday, I moved her to 4/8, can you call and see? They sent [place] the wrong thing.if she did not do what he ordered let me know bc she will have to do labs

## 2019-04-11 NOTE — Telephone Encounter (Signed)
[place] Dept will fax lab results from last Wednesday.

## 2019-04-18 DIAGNOSIS — A64 Unspecified sexually transmitted disease: Secondary | ICD-10-CM | POA: Diagnosis not present

## 2019-04-18 DIAGNOSIS — O24414 Gestational diabetes mellitus in pregnancy, insulin controlled: Secondary | ICD-10-CM | POA: Diagnosis not present

## 2019-04-19 ENCOUNTER — Ambulatory Visit: Payer: Medicaid Other | Admitting: "Endocrinology

## 2019-04-19 ENCOUNTER — Ambulatory Visit (INDEPENDENT_AMBULATORY_CARE_PROVIDER_SITE_OTHER): Payer: BC Managed Care – PPO | Admitting: "Endocrinology

## 2019-04-19 ENCOUNTER — Other Ambulatory Visit: Payer: Self-pay

## 2019-04-19 ENCOUNTER — Encounter: Payer: Self-pay | Admitting: "Endocrinology

## 2019-04-19 VITALS — BP 115/73 | HR 91 | Ht 62.0 in | Wt 171.8 lb

## 2019-04-19 DIAGNOSIS — C73 Malignant neoplasm of thyroid gland: Secondary | ICD-10-CM | POA: Diagnosis not present

## 2019-04-19 DIAGNOSIS — E890 Postprocedural hypothyroidism: Secondary | ICD-10-CM

## 2019-04-19 NOTE — Progress Notes (Signed)
04/19/2019, 9:28 PM       Endocrinology follow-up note  Subjective:    Patient ID: JANIELIS DOLLOFF, female    DOB: [birth year], PCP Health, [facility] Personal   Past Medical History:  Diagnosis Date  . Asthma   . Diabetes mellitus without complication ([place])    Gestational    Past Surgical History:  Procedure Laterality Date  . BREAST REDUCTION SURGERY    . CESAREAN SECTION    . THYROIDECTOMY N/A 12/15/2018   Procedure: TOTAL THYROIDECTOMY;  Surgeon: Armandina Gemma, MD;  Location: [facility] OR;  Service: General;  Laterality: N/A;   Social History   Socioeconomic History  . Marital status: Single    Spouse name: Not on file  . Number of children: Not on file  . Years of education: Not on file  . Highest education level: Not on file  Occupational History  . Not on file  Tobacco Use  . Smoking status: Never Smoker  . Smokeless tobacco: Never Used  Substance and Sexual Activity  . Alcohol use: No  . Drug use: No  . Sexual activity: Yes    Birth control/protection: None  Other Topics Concern  . Not on file  Social History Narrative  . Not on file   Social Determinants of Health   Financial Resource Strain:   . Difficulty of Paying Living Expenses:   Food Insecurity:   . Worried About Charity fundraiser in the Last Year:   . Arboriculturist in the Last Year:   Transportation Needs:   . Film/video editor (Medical):   Marland Kitchen Lack of Transportation (Non-Medical):   Physical Activity:   . Days of Exercise per Week:   . Minutes of Exercise per Session:   Stress:   . Feeling of Stress :   Social Connections:   . Frequency of Communication with Friends and Family:   . Frequency of Social Gatherings with Friends and Family:   . Attends Religious Services:   . Active Member of Clubs or Organizations:   . Attends Archivist Meetings:   Marland Kitchen Marital Status:    Family History  Problem Relation  Age of Onset  . Diabetes Mother   . Hypertension Mother   . Hypertension Father   . Hyperlipidemia Father    Outpatient Encounter Medications as of 04/19/2019  Medication Sig  . calcium carbonate (OS-CAL - DOSED IN MG OF ELEMENTAL CALCIUM) 1250 (500 Ca) MG tablet Take 2 tablets (1,000 mg of elemental calcium total) by mouth 3 (three) times daily with meals.  Marland Kitchen levothyroxine (SYNTHROID) 137 MCG tablet Take 1 tablet (137 mcg total) by mouth daily before breakfast.  . Prenatal Vit-Fe Fumarate-FA (PRENATAL MULTIVITAMIN) 60-1 MG tablet Take 1 tablet by mouth daily.    . [DISCONTINUED] HYDROcodone-acetaminophen (NORCO/VICODIN) 5-325 MG tablet Take 1-2 tablets by mouth every 4 (four) hours as needed for moderate pain.   No facility-administered encounter medications on file as of 04/19/2019.   ALLERGIES: No Known Allergies  VACCINATION STATUS:  There is no immunization history on file for this patient.  HPI Kevon Arman Hyland is 34 y.o. female who is being engaged in telehealth via telephone for postsurgical  hypothyroidism and recent diagnosis of follicular thyroid malignancy.   She is currently  6 weeks post partum . She is on LT4 137 mcg po qam. She is compliant.    She underwent fine-needle aspiration of an nodular goiter on November 04, 2018.  Cytology report confirmed atypia of undetermined significance.  Subsequent molecular studies confirm approximately 50% risk of malignancy.   She was sent for thyroidectomy during her second trimester pregnancy. She underwent total thyroidectomy on December 15, 2018.  Her surgical sample showed follicular neoplasm.  Outside consultation to Dr. Holly Bodily of [facility] reviewed the slides and summary was 7.7 cm left lobe mass: Grossly encapsulated, angioinvasive, follicular carcinoma with at least 2 foci of vascular invasion in tumor capsule; tumor confined to the thyroid.    She is recovering from her surgery very well.       She denies   dysphagia, shortness of breath, nor voice change.   She denies any family history of thyroid malignancy, however her mother has hypothyroidism on thyroid hormone supplement. She denies exposure to neck radiation.   Review of Systems Limited as above.  Objective:    BP 115/73   Pulse 91   Ht 5\' 2"  (1.575 m)   Wt 171 lb 12.8 oz (77.9 kg)   BMI 31.42 kg/m   Wt Readings from Last 3 Encounters:  04/19/19 171 lb 12.8 oz (77.9 kg)  12/15/18 191 lb 2.2 oz (86.7 kg)  12/14/18 191 lb 3 oz (86.7 kg)    Physical Exam  Physical Exam- Limited  Constitutional:  Body mass index is 31.42 kg/m. , not in acute distress, normal state of mind Eyes:  EOMI, no exophthalmos Neck: Supple Thyroid: post thyroidectomy scar, no mass lesions. Respiratory: Adequate breathing efforts Musculoskeletal: no gross deformities, strength intact in all four extremities, no gross restriction of joint movements Skin:  no rashes, no hyperemia Neurological: no tremor with outstretched hands,    CMP ( most recent) CMP     Component Value Date/Time   NA 133 (L) 12/16/2018 0534   K 3.8 12/16/2018 0534   CL 101 12/16/2018 0534   CO2 22 12/16/2018 0534   GLUCOSE 128 (H) 12/16/2018 0534   BUN <5 (L) 12/16/2018 0534   CREATININE 0.39 (L) 12/16/2018 0534   CALCIUM 8.7 (L) 12/16/2018 0534   GFRNONAA >60 12/16/2018 0534   GFRAA >60 12/16/2018 0534     Summary of her thyroid ultrasound from September 10, 2018: Right lobe 5.1 cm x 1.2 cm x 1.3 cm-no nodules. Left lobe 8.5 mm x 5.3 cm x 4.4 cm with 7.7 cm nodule observed to increase in size compared to prior study from February 2018.  Thyroid fine-needle aspiration cytology: Atypia of undetermined significance.  The smears are cellular with sheets of follicular cells, follicles and colloid.  There are scattered microfollicular aggregates present.  No papillary architecture or Hurthle cell change.  Afirma 50% risk of Malignancy   Total thyroidectomy on December 15, 2018 FINAL MICROSCOPIC DIAGNOSIS: A. THYROID, TOTAL THYROIDECTOMY: - Follicular neoplasm, pending outside consultation - See comment COMMENT: Slides were sent for outside consultation. Outside consultation to Dr. Holly Bodily of [facility] reviewed the slides and summary was 7.7 cm left lobe mass: Grossly encapsulated, angioinvasive, follicular carcinoma with at least 2 foci of vascular invasion in tumor capsule; tumor confined to the thyroid.    Recent Results (from the past 2160 hour(s))  TSH     Status: None   Collection Time:  04/06/19 12:00 AM  Result Value Ref Range   TSH 0.45 0.41 - 5.90    Comment: free t4 1.77, TG <1, TG abs 0.2     Assessment & Plan:    1.  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  Nodular goiter -resolved 3-postsurgical hypothyroidism  -She is status post total thyroidectomy, follicular carcinoma confirmed on left lobe.   I had a long discussion about her diagnosis, treatment administered so far, remaining therapy, and long term follow up protocols.  She is 6 weeks post -partum, she is  not  breast-feeding. Her unstimulated TG is <0.1. However she will need adjuvant therapy with I-131 thyroid remnant ablation.  This treatment will be scheduled to be administered in [facility] in the next several weeks followed by post-therapy whole body scan.  She is aware of isolation precautions- will arrange for baby sitter after her dosing of I-131.    For postsurgical hypothyroidism: Her pre-visit thyroid function tests are consistent with appropriate suppressive therapy. She is advised to continue  levothyroxine  137 mcg p.o. daily before breakfast.     - We discussed about the correct intake of her thyroid hormone, on empty stomach at fasting, with water, separated by at least 30 minutes from breakfast and other medications,  and separated by more than 4 hours from calcium, iron, multivitamins, acid reflux medications (PPIs). -Patient is made aware of the  fact that thyroid hormone replacement is needed for life, dose to be adjusted by periodic monitoring of thyroid function tests.  She is urged about the absolute need for close follow up for cancer surveillance.   - I advised her  to maintain close follow up with Health, [facility] Personal for primary care needs, as well as her OB/GYN given her pregnancy.  - Patient Care Time Today:  35 min, of which >50% was spent in  counseling and the rest reviewing her surgical pathology results, previous imaging studies,and medications' doses and developing a plan for long-term care based on the latest recommendations for standards of care.   Ji D Seres participated in the discussions, expressed understanding, and voiced agreement with the above plans.  All questions were answered to her satisfaction. she is encouraged to contact clinic should she have any questions or concerns prior to her return visit.   Follow up plan: Return in about 5 weeks (around 05/24/2019) for Follow up with Whole Body Scan w/Thyrogen.   Glade Lloyd, MD [facility] [address] [place], [place] [address] Phone: [phone]  Fax: [phone]     04/19/2019, 9:28 PM  This note was partially dictated with voice recognition software. Similar sounding words can be transcribed inadequately or may not  be corrected upon review.

## 2019-04-21 LAB — SURGICAL PATHOLOGY

## 2019-04-28 ENCOUNTER — Ambulatory Visit: Payer: Medicaid Other | Admitting: "Endocrinology

## 2019-05-04 ENCOUNTER — Ambulatory Visit (HOSPITAL_COMMUNITY)
Admission: RE | Admit: 2019-05-04 | Discharge: 2019-05-04 | Disposition: A | Discharge status: Home / Self Care | Payer: BC Managed Care – PPO | Source: Ambulatory Visit | Attending: "Endocrinology | Admitting: "Endocrinology

## 2019-05-04 ENCOUNTER — Other Ambulatory Visit: Payer: Self-pay

## 2019-05-04 ENCOUNTER — Encounter (HOSPITAL_COMMUNITY): Payer: Self-pay

## 2019-05-04 DIAGNOSIS — C73 Malignant neoplasm of thyroid gland: Secondary | ICD-10-CM | POA: Insufficient documentation

## 2019-05-04 DIAGNOSIS — E890 Postprocedural hypothyroidism: Secondary | ICD-10-CM | POA: Diagnosis not present

## 2019-05-04 HISTORY — DX: Malignant (primary) neoplasm, unspecified: C80.1

## 2019-05-04 MED ORDER — STERILE WATER FOR INJECTION IJ SOLN
INTRAMUSCULAR | Status: AC
  Filled 2019-05-04: qty 10

## 2019-05-04 MED ORDER — THYROTROPIN ALFA 1.1 MG IM SOLR: 0.9000 mg | INTRAMUSCULAR | Status: AC

## 2019-05-04 MED ORDER — THYROTROPIN ALFA 1.1 MG IM SOLR
INTRAMUSCULAR | Status: AC
  Administered 2019-05-04: 08:00:00 0.9 mg via INTRAMUSCULAR
  Filled 2019-05-04: qty 0.9

## 2019-05-05 ENCOUNTER — Encounter (HOSPITAL_COMMUNITY): Payer: Self-pay

## 2019-05-05 ENCOUNTER — Ambulatory Visit (HOSPITAL_COMMUNITY)
Admission: RE | Admit: 2019-05-05 | Discharge: 2019-05-05 | Disposition: A | Discharge status: Home / Self Care | Payer: BC Managed Care – PPO | Source: Ambulatory Visit | Attending: "Endocrinology | Admitting: "Endocrinology

## 2019-05-05 DIAGNOSIS — E890 Postprocedural hypothyroidism: Secondary | ICD-10-CM

## 2019-05-05 DIAGNOSIS — C73 Malignant neoplasm of thyroid gland: Secondary | ICD-10-CM | POA: Diagnosis not present

## 2019-05-05 MED ORDER — THYROTROPIN ALFA 1.1 MG IM SOLR: 0.9000 mg | INTRAMUSCULAR | Status: AC

## 2019-05-05 MED ORDER — THYROTROPIN ALFA 1.1 MG IM SOLR
INTRAMUSCULAR | Status: AC
  Administered 2019-05-05: 0.9 mg via INTRAMUSCULAR
  Filled 2019-05-05: qty 0.9

## 2019-05-05 MED ORDER — STERILE WATER FOR INJECTION IJ SOLN
INTRAMUSCULAR | Status: AC
  Administered 2019-05-05: 1 mL
  Filled 2019-05-05: qty 10

## 2019-05-06 ENCOUNTER — Other Ambulatory Visit: Payer: Self-pay

## 2019-05-06 ENCOUNTER — Ambulatory Visit (HOSPITAL_COMMUNITY)
Admission: RE | Admit: 2019-05-06 | Discharge: 2019-05-06 | Disposition: A | Discharge status: Home / Self Care | Payer: BC Managed Care – PPO | Source: Ambulatory Visit | Attending: "Endocrinology | Admitting: "Endocrinology

## 2019-05-06 DIAGNOSIS — Z9089 Acquired absence of other organs: Secondary | ICD-10-CM | POA: Diagnosis not present

## 2019-05-06 MED ORDER — SODIUM IODIDE I 131 CAPSULE
121.0000 | Freq: Once | INTRAVENOUS | Status: AC
  Administered 2019-05-06: 121 via ORAL

## 2019-05-16 ENCOUNTER — Other Ambulatory Visit: Payer: Self-pay

## 2019-05-16 ENCOUNTER — Ambulatory Visit (HOSPITAL_COMMUNITY)
Admission: RE | Admit: 2019-05-16 | Discharge: 2019-05-16 | Disposition: A | Discharge status: Home / Self Care | Payer: BC Managed Care – PPO | Source: Ambulatory Visit | Attending: "Endocrinology | Admitting: "Endocrinology

## 2019-05-16 DIAGNOSIS — Z9089 Acquired absence of other organs: Secondary | ICD-10-CM | POA: Diagnosis not present

## 2019-05-16 DIAGNOSIS — E890 Postprocedural hypothyroidism: Secondary | ICD-10-CM | POA: Insufficient documentation

## 2019-05-16 DIAGNOSIS — C73 Malignant neoplasm of thyroid gland: Secondary | ICD-10-CM | POA: Diagnosis not present

## 2019-05-27 ENCOUNTER — Ambulatory Visit (INDEPENDENT_AMBULATORY_CARE_PROVIDER_SITE_OTHER): Payer: BC Managed Care – PPO | Admitting: "Endocrinology

## 2019-05-27 ENCOUNTER — Other Ambulatory Visit: Payer: Self-pay

## 2019-05-27 ENCOUNTER — Encounter: Payer: Self-pay | Admitting: "Endocrinology

## 2019-05-27 VITALS — BP 123/84 | HR 79 | Ht 62.0 in | Wt 173.0 lb

## 2019-05-27 DIAGNOSIS — C73 Malignant neoplasm of thyroid gland: Secondary | ICD-10-CM | POA: Diagnosis not present

## 2019-05-27 DIAGNOSIS — E890 Postprocedural hypothyroidism: Secondary | ICD-10-CM | POA: Diagnosis not present

## 2019-05-27 MED ORDER — LEVOTHYROXINE SODIUM 137 MCG PO TABS: 137.0000 ug | ORAL_TABLET | Freq: Every day | ORAL | 1 refills | Status: DC

## 2019-05-27 NOTE — Progress Notes (Signed)
05/27/2019, 12:22 PM       Endocrinology follow-up note  Subjective:    Patient ID: Amy Nelson, female    DOB: [birth year], PCP Health, [facility] Personal   Past Medical History:  Diagnosis Date  . Asthma   . Cancer ([place])    Thyroid  . Diabetes mellitus without complication ([place])    Gestational    Past Surgical History:  Procedure Laterality Date  . BREAST REDUCTION SURGERY    . CESAREAN SECTION    . THYROIDECTOMY N/A 12/15/2018   Procedure: TOTAL THYROIDECTOMY;  Surgeon: Armandina Gemma, MD;  Location: [facility] OR;  Service: General;  Laterality: N/A;   Social History   Socioeconomic History  . Marital status: Single    Spouse name: Not on file  . Number of children: Not on file  . Years of education: Not on file  . Highest education level: Not on file  Occupational History  . Not on file  Tobacco Use  . Smoking status: Never Smoker  . Smokeless tobacco: Never Used  Substance and Sexual Activity  . Alcohol use: No  . Drug use: No  . Sexual activity: Yes    Birth control/protection: None  Other Topics Concern  . Not on file  Social History Narrative  . Not on file   Social Determinants of Health   Financial Resource Strain:   . Difficulty of Paying Living Expenses:   Food Insecurity:   . Worried About Charity fundraiser in the Last Year:   . Arboriculturist in the Last Year:   Transportation Needs:   . Film/video editor (Medical):   Marland Kitchen Lack of Transportation (Non-Medical):   Physical Activity:   . Days of Exercise per Week:   . Minutes of Exercise per Session:   Stress:   . Feeling of Stress :   Social Connections:   . Frequency of Communication with Friends and Family:   . Frequency of Social Gatherings with Friends and Family:   . Attends Religious Services:   . Active Member of Clubs or Organizations:   . Attends Archivist Meetings:   Marland Kitchen Marital Status:    Family  History  Problem Relation Age of Onset  . Diabetes Mother   . Hypertension Mother   . Hypertension Father   . Hyperlipidemia Father    Outpatient Encounter Medications as of 05/27/2019  Medication Sig  . calcium carbonate (OS-CAL - DOSED IN MG OF ELEMENTAL CALCIUM) 1250 (500 Ca) MG tablet Take 2 tablets (1,000 mg of elemental calcium total) by mouth 3 (three) times daily with meals.  Marland Kitchen levothyroxine (SYNTHROID) 137 MCG tablet Take 1 tablet (137 mcg total) by mouth daily before breakfast.  . Prenatal Vit-Fe Fumarate-FA (PRENATAL MULTIVITAMIN) 60-1 MG tablet Take 1 tablet by mouth daily.    . [DISCONTINUED] levothyroxine (SYNTHROID) 137 MCG tablet Take 1 tablet (137 mcg total) by mouth daily before breakfast.   No facility-administered encounter medications on file as of 05/27/2019.   ALLERGIES: No Known Allergies  VACCINATION STATUS:  There is no immunization history on file for this patient.  HPI Amy Nelson is 34 y.o. 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   She is 2 months postpartum.    -She underwent Thyrogen stimulated I-131 thyroid remnant ablation since her last visit.  Post therapy whole-body scan was negative for distant metastases, possible thyroid remnant in the neck. -She denies dysphagia, shortness of breath, nor voice change. She is on LT4 137 mcg po qam. She is compliant.  She denies palpitations, tremors, heat/cold intolerance.   Her prior history is as follows: She underwent fine-needle aspiration of an nodular goiter on November 04, 2018.  Cytology report confirmed atypia of undetermined significance.  Subsequent molecular studies confirm approximately 50% risk of malignancy.   She was sent for thyroidectomy during her second trimester pregnancy. She underwent total thyroidectomy on December 15, 2018.  Her surgical sample showed follicular neoplasm.  Outside consultation to  Dr. Holly Bodily of [facility] reviewed the slides and summary was 7.7 cm left lobe mass: Grossly encapsulated, angioinvasive, follicular carcinoma with at least 2 foci of vascular invasion in tumor capsule; tumor confined to the thyroid.    -Thyrogen stimulated I-131 thyroid ablation and   Post therapy whole-body scan performed from April 16-April 26, see below.  She has recovered from her surgery very well.  She came with her baby daughter, Gloriann Loan, not breast-feeding.   She denies any family history of thyroid malignancy, however her mother has hypothyroidism on thyroid hormone supplement. She denies exposure to neck radiation.   Review of Systems Limited as above.  Objective:    BP 123/84   Pulse 79   Ht 5\' 2"  (1.575 m)   Wt 173 lb (78.5 kg)   LMP 06/13/2012   BMI 31.64 kg/m   Wt Readings from Last 3 Encounters:  05/27/19 173 lb (78.5 kg)  04/19/19 171 lb 12.8 oz (77.9 kg)  12/15/18 191 lb 2.2 oz (86.7 kg)    Physical Exam  Physical Exam- Limited  Constitutional:  Body mass index is 31.64 kg/m. , not in acute distress, normal state of mind Eyes:  EOMI, no exophthalmos Neck: Supple Thyroid: + Post thyroidectomy scar with some minimal keloid.   Respiratory: Adequate breathing efforts Musculoskeletal: no gross deformities, strength intact in all four extremities, no gross restriction of joint movements Skin:  no rashes, no hyperemia Neurological: no tremor with outstretched hands,     CMP ( most recent) CMP     Component Value Date/Time   NA 133 (L) 12/16/2018 0534   K 3.8 12/16/2018 0534   CL 101 12/16/2018 0534   CO2 22 12/16/2018 0534   GLUCOSE 128 (H) 12/16/2018 0534   BUN <5 (L) 12/16/2018 0534   CREATININE 0.39 (L) 12/16/2018 0534   CALCIUM 8.7 (L) 12/16/2018 0534   GFRNONAA >60 12/16/2018 0534   GFRAA >60 12/16/2018 0534     Summary of her thyroid ultrasound from September 10, 2018: Right lobe 5.1 cm x 1.2 cm x 1.3 cm-no nodules. Left lobe  8.5 mm x 5.3 cm x 4.4 cm with 7.7 cm nodule observed to increase in size compared to prior study from February 2018.  Thyroid fine-needle aspiration cytology: Atypia of undetermined significance.  The smears are cellular with sheets of follicular cells, follicles and colloid.  There are scattered microfollicular aggregates present.  No papillary architecture or Hurthle cell change.  Afirma 50% risk of Malignancy   Total thyroidectomy on December 15, 2018 FINAL MICROSCOPIC DIAGNOSIS: A. THYROID, TOTAL THYROIDECTOMY: - Follicular neoplasm, pending outside consultation - See comment  COMMENT: Slides were sent for outside consultation. Outside consultation to Dr. Holly Bodily of [facility] reviewed the slides and summary was 7.7 cm left lobe mass: Grossly encapsulated, angioinvasive, follicular carcinoma with at least 2 foci of vascular invasion in tumor capsule; tumor confined to the thyroid.    Recent Results (from the past 2160 hour(s))  TSH     Status: None   Collection Time: 04/06/19 12:00 AM  Result Value Ref Range   TSH 0.45 0.41 - 5.90    Comment: free t4 1.77, TG <1, TG abs 0.2   Thyroid insulated I-131 thyroid remnant ablation between April 16-3/26/2021. FINDINGS: Uptake at the thyroid bed bilaterally consistent with remnant. No additional sites of abnormal radio iodine accumulation are seen to suggest iodine-avid metastatic thyroid cancer.  Expected tracer excretion into urinary bladder.  IMPRESSION: Thyroid remnant bilaterally. No scintigraphic evidence of iodine-avid metastatic thyroid cancer.    Assessment & Plan:    1.  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  Nodular goiter -resolved 3-postsurgical hypothyroidism  -She is status post total thyroidectomy, follicular carcinoma confirmed on left lobe.   She is 2 months postpartum, not breast-feeding.  She came with her baby daughter , Gloriann Loan.   Her recent unstimulated TG is <0.1.  She is  status post thyroidectomy stimulated I-131 thyroid remnant ablation with post therapy scan  IMPRESSION: Thyroid remnant bilaterally. No scintigraphic evidence of iodine-avid metastatic thyroid cancer.   This completes the initial intensive phase of thyroid cancer.  She is made aware of the need for continued surveillance studies for the next 5 to 8 years including thyroid/neck ultrasound before her next visit in 6 months.   For postsurgical hypothyroidism: Her pre-visit thyroid function tests are consistent with appropriate suppressive therapy. She is advised to continue levothyroxine 137 mcg p.o. daily before breakfast.   - We discussed about the correct intake of her thyroid hormone, on empty stomach at fasting, with water, separated by at least 30 minutes from breakfast and other medications,  and separated by more than 4 hours from calcium, iron, multivitamins, acid reflux medications (PPIs). -Patient is made aware of the fact that thyroid hormone replacement is needed for life, dose to be adjusted by periodic monitoring of thyroid function tests.   She is urged about the absolute need for close follow up for cancer surveillance, as well as post surgical hypothyroidism   - I advised her  to maintain close follow up with [facility], [place] for primary care needs, as well as her OB/GYN given her pregnancy.   - Time spent on this patient care encounter:  35 min, of which >50% was spent in  counseling and the rest reviewing her  current and  previous labs/studies ( including abstraction from other facilities),   current and previous imaging studies, previous treatments, her blood glucose readings, and medications' doses and developing a plan for long-term care based on the latest recommendations for standards of care; and documenting her care.  Aylani D Fishburn participated in the discussions, expressed understanding, and voiced agreement with the above plans.  All  questions were answered to her satisfaction. she is encouraged to contact clinic should she have any questions or concerns prior to her return visit.   Follow up plan: Return in about 6 months (around 11/27/2019) for Follow up with Pre-visit Labs.   Glade Lloyd, MD [facility] [address] [place], [place] [address] Phone: [phone]  Fax:  [phone]     05/27/2019, 12:22 PM  This note was partially dictated with voice recognition software. Similar sounding words can be transcribed inadequately or may not  be corrected upon review.

## 2019-06-30 DIAGNOSIS — Z113 Encounter for screening for infections with a predominantly sexual mode of transmission: Secondary | ICD-10-CM | POA: Diagnosis not present

## 2019-06-30 DIAGNOSIS — N760 Acute vaginitis: Secondary | ICD-10-CM | POA: Diagnosis not present

## 2019-06-30 DIAGNOSIS — Z8632 Personal history of gestational diabetes: Secondary | ICD-10-CM | POA: Diagnosis not present

## 2019-11-01 ENCOUNTER — Telehealth: Payer: Self-pay

## 2019-11-01 NOTE — Telephone Encounter (Signed)
Opened in error

## 2019-11-07 ENCOUNTER — Other Ambulatory Visit: Payer: Self-pay | Admitting: "Endocrinology

## 2019-11-07 DIAGNOSIS — C73 Malignant neoplasm of thyroid gland: Secondary | ICD-10-CM | POA: Diagnosis not present

## 2019-11-07 DIAGNOSIS — E890 Postprocedural hypothyroidism: Secondary | ICD-10-CM | POA: Diagnosis not present

## 2019-11-09 ENCOUNTER — Encounter: Payer: Self-pay | Admitting: "Endocrinology

## 2019-11-09 ENCOUNTER — Ambulatory Visit (INDEPENDENT_AMBULATORY_CARE_PROVIDER_SITE_OTHER): Payer: Medicaid Other | Admitting: "Endocrinology

## 2019-11-09 ENCOUNTER — Other Ambulatory Visit: Payer: Self-pay

## 2019-11-09 VITALS — BP 120/82 | HR 112 | Ht 62.0 in | Wt 180.6 lb

## 2019-11-09 DIAGNOSIS — C73 Malignant neoplasm of thyroid gland: Secondary | ICD-10-CM | POA: Diagnosis not present

## 2019-11-09 DIAGNOSIS — E890 Postprocedural hypothyroidism: Secondary | ICD-10-CM

## 2019-11-09 MED ORDER — LEVOTHYROXINE SODIUM 125 MCG PO TABS
125.0000 ug | ORAL_TABLET | Freq: Every day | ORAL | 1 refills | Status: DC
Start: 2019-11-09 — End: 2020-05-10

## 2019-11-09 NOTE — Progress Notes (Signed)
11/09/2019, 5:20 PM       Endocrinology follow-up note  Subjective:    Patient ID: Amy Nelson, female    DOB: 10-Apr-1985, PCP Tollie Eth, NP   Past Medical History:  Diagnosis Date  . Asthma   . Cancer ([place])    Thyroid  . Diabetes mellitus without complication ([place])    Gestational    Past Surgical History:  Procedure Laterality Date  . BREAST REDUCTION SURGERY    . CESAREAN SECTION    . THYROIDECTOMY N/A 12/15/2018   Procedure: TOTAL THYROIDECTOMY;  Surgeon: Armandina Gemma, MD;  Location: [facility] OR;  Service: General;  Laterality: N/A;   Social History   Socioeconomic History  . Marital status: Single    Spouse name: Not on file  . Number of children: Not on file  . Years of education: Not on file  . Highest education level: Not on file  Occupational History  . Not on file  Tobacco Use  . Smoking status: Never Smoker  . Smokeless tobacco: Never Used  Vaping Use  . Vaping Use: Never used  Substance and Sexual Activity  . Alcohol use: No  . Drug use: No  . Sexual activity: Yes    Birth control/protection: None  Other Topics Concern  . Not on file  Social History Narrative  . Not on file   Social Determinants of Health   Financial Resource Strain:   . Difficulty of Paying Living Expenses: Not on file  Food Insecurity:   . Worried About Charity fundraiser in the Last Year: Not on file  . Ran Out of Food in the Last Year: Not on file  Transportation Needs:   . Lack of Transportation (Medical): Not on file  . Lack of Transportation (Non-Medical): Not on file  Physical Activity:   . Days of Exercise per Week: Not on file  . Minutes of Exercise per Session: Not on file  Stress:   . Feeling of Stress : Not on file  Social Connections:   . Frequency of Communication with Friends and Family: Not on file  . Frequency of Social Gatherings with Friends and Family: Not on file  . Attends Religious  Services: Not on file  . Active Member of Clubs or Organizations: Not on file  . Attends Archivist Meetings: Not on file  . Marital Status: Not on file   Family History  Problem Relation Age of Onset  . Diabetes Mother   . Hypertension Mother   . Hypertension Father   . Hyperlipidemia Father    Outpatient Encounter Medications as of 11/09/2019  Medication Sig  . levothyroxine (SYNTHROID) 125 MCG tablet Take 1 tablet (125 mcg total) by mouth daily before breakfast.  . Prenatal Vit-Fe Fumarate-FA (PRENATAL MULTIVITAMIN) 60-1 MG tablet Take 1 tablet by mouth daily.    . [DISCONTINUED] calcium carbonate (OS-CAL - DOSED IN MG OF ELEMENTAL CALCIUM) 1250 (500 Ca) MG tablet Take 2 tablets (1,000 mg of elemental calcium total) by mouth 3 (three) times daily with meals.  . [DISCONTINUED] levothyroxine (SYNTHROID) 137 MCG tablet Take 1 tablet (137 mcg total) by mouth daily before breakfast.   No facility-administered encounter medications on file as of  11/09/2019.   ALLERGIES: No Known Allergies  VACCINATION STATUS:  There is no immunization history on file for this patient.  HPI Amy Nelson is 34 y.o. 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  -She underwent thyroidectomy November 2020. -She underwent Thyrogen stimulated I-131 thyroid remnant ablation subsequently.   Post therapy whole-body scan was negative for distant metastases, possible thyroid remnant in the neck. She missed her appointment for thyroid ultrasound before this visit. -She denies dysphagia, shortness of breath, nor voice change. She is on LT4 137 mcg po qam. She is compliant.  She denies palpitations, tremors, heat/cold intolerance. Her previsit labs are consistent with slight over replacement.   Her prior history is as follows: She underwent fine-needle aspiration of an nodular goiter on November 04, 2018.  Cytology  report confirmed atypia of undetermined significance.  Subsequent molecular studies confirm approximately 50% risk of malignancy.   She was sent for thyroidectomy during her second trimester pregnancy. She underwent total thyroidectomy on December 15, 2018.  Her surgical sample showed follicular neoplasm.  Outside consultation to Dr. Holly Bodily of [facility] reviewed the slides and summary was 7.7 cm left lobe mass: Grossly encapsulated, angioinvasive, follicular carcinoma with at least 2 foci of vascular invasion in tumor capsule; tumor confined to the thyroid.    -Thyrogen stimulated I-131 thyroid ablation and   Post therapy whole-body scan performed from April 16-April 26, see below.  She has recovered from her surgery very well.  She is 7 months postpartum, not breast-feeding. She denies any family history of thyroid malignancy, however her mother has hypothyroidism on thyroid hormone supplement. She denies exposure to neck radiation.   Review of Systems Limited as above.  Objective:    BP 120/82   Pulse (!) 112   Ht 5\' 2"  (1.575 m)   Wt 180 lb 9.6 oz (81.9 kg)   LMP 06/13/2012   BMI 33.03 kg/m   Wt Readings from Last 3 Encounters:  11/09/19 180 lb 9.6 oz (81.9 kg)  05/27/19 173 lb (78.5 kg)  04/19/19 171 lb 12.8 oz (77.9 kg)    Physical Exam  Physical Exam- Limited  Constitutional:  Body mass index is 33.03 kg/m. , not in acute distress, normal state of mind Eyes:  EOMI, no exophthalmos Neck: Supple Thyroid: + Post thyroidectomy scar with some minimal keloid. Respiratory: Adequate breathing efforts Musculoskeletal: no gross deformities, strength intact in all four extremities, no gross restriction of joint movements Skin:  no rashes, no hyperemia Neurological: no tremor with outstretched hands,     CMP ( most recent) CMP     Component Value Date/Time   NA 133 (L) 12/16/2018 0534   K 3.8 12/16/2018 0534   CL 101 12/16/2018 0534   CO2 22 12/16/2018  0534   GLUCOSE 128 (H) 12/16/2018 0534   BUN <5 (L) 12/16/2018 0534   CREATININE 0.39 (L) 12/16/2018 0534   CALCIUM 8.7 (L) 12/16/2018 0534   GFRNONAA >60 12/16/2018 0534   GFRAA >60 12/16/2018 0534     Summary of her thyroid ultrasound from September 10, 2018: Right lobe 5.1 cm x 1.2 cm x 1.3 cm-no nodules. Left lobe 8.5 mm x 5.3 cm x 4.4 cm with 7.7 cm nodule observed to increase in size compared to prior study from February 2018.  Thyroid fine-needle aspiration cytology: Atypia of undetermined significance.  The smears are cellular with sheets of follicular cells, follicles and colloid.  There are scattered microfollicular aggregates present.  No papillary architecture or Hurthle cell change.  Afirma 50% risk of Malignancy   Total thyroidectomy on December 15, 2018 FINAL MICROSCOPIC DIAGNOSIS: A. THYROID, TOTAL THYROIDECTOMY: - Follicular neoplasm, pending outside consultation - See comment COMMENT: Slides were sent for outside consultation. Outside consultation to Dr. Holly Bodily of [facility] reviewed the slides and summary was 7.7 cm left lobe mass: Grossly encapsulated, angioinvasive, follicular carcinoma with at least 2 foci of vascular invasion in tumor capsule; tumor confined to the thyroid.    Recent Results (from the past 2160 hour(s))  Comprehensive Thyroglobulin     Status: None (Preliminary result)   Collection Time: 11/07/19  8:52 AM  Result Value Ref Range   Anti-Thyroglobulin Antibodies WILL FOLLOW    Thyroglobulin (ICMA) WILL FOLLOW    Thyroglobulin (TG-RIA) WILL FOLLOW   T4, free     Status: Abnormal   Collection Time: 11/07/19  8:52 AM  Result Value Ref Range   Free T4 1.86 (H) 0.82 - 1.77 ng/dL  TSH     Status: None   Collection Time: 11/07/19  8:52 AM  Result Value Ref Range   TSH 0.958 0.450 - 4.500 uIU/mL  Thyroglobulin antibody     Status: None   Collection Time: 11/07/19  8:52 AM  Result Value Ref Range   Thyroglobulin Antibody <1.0 0.0  - 0.9 IU/mL    Comment: Thyroglobulin Antibody measured by [employer] Coulter Methodology   Thyroid insulated I-131 thyroid remnant ablation between April 16-3/26/2021. FINDINGS: Uptake at the thyroid bed bilaterally consistent with remnant. No additional sites of abnormal radio iodine accumulation are seen to suggest iodine-avid metastatic thyroid cancer.  Expected tracer excretion into urinary bladder.  IMPRESSION: Thyroid remnant bilaterally. No scintigraphic evidence of iodine-avid metastatic thyroid cancer.    Assessment & Plan:    1.  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  Nodular goiter -resolved 3-postsurgical hypothyroidism  -She is status post total thyroidectomy, follicular carcinoma confirmed on left lobe.   She is 7 months postpartum, not breast-feeding.   Previously her recent unstimulated TG is <0.1.  She is status post thyroidectomy stimulated I-131 thyroid remnant ablation with post therapy scan  IMPRESSION: Thyroid remnant bilaterally. No scintigraphic evidence of iodine-avid metastatic thyroid cancer.  She will have thyroid/neck ultrasound before her next visit in 6 months.   This completes the initial intensive phase of thyroid cancer.  She is made aware of the need for continued surveillance studies for the next 5 to 8 years including thyroid/neck ultrasound before her next visit in 6 months.   For postsurgical hypothyroidism: Her pre-visit thyroid function tests are consistent with slight over replacement.  I discussed and lowered her levothyroxine to 125 mcg p.o. daily before breakfast.     - We discussed about the correct intake of her thyroid hormone, on empty stomach at fasting, with water, separated by at least 30 minutes from breakfast and other medications,  and separated by more than 4 hours from calcium, iron, multivitamins, acid reflux medications (PPIs). -Patient is made aware of the fact that thyroid hormone replacement is needed  for life, dose to be adjusted by periodic monitoring of thyroid function tests.  She is urged about the absolute need for close follow up for cancer surveillance, as well as post surgical hypothyroidism   - I advised her  to maintain close follow up with Tollie Eth, NP for primary care needs, as well as her OB/GYN given her pregnancy.    -  Time spent on this patient care encounter:  20 minutes of which 50% was spent in  counseling and the rest reviewing  her current and  previous labs / studies and medications  doses and developing a plan for long term care. Ineze D Rahimi  participated in the discussions, expressed understanding, and voiced agreement with the above plans.  All questions were answered to her satisfaction. she is encouraged to contact clinic should she have any questions or concerns prior to her return visit.    Follow up plan: Return in about 6 months (around 05/09/2020) for F/U with Pre-visit Labs, Thyroid / Neck Ultrasound.   Glade Lloyd, MD [facility] [address] [place], [place] [address] Phone: [phone]  Fax: [phone]     11/09/2019, 5:20 PM  This note was partially dictated with voice recognition software. Similar sounding words can be transcribed inadequately or may not  be corrected upon review.

## 2019-11-12 LAB — T4, FREE: Free T4: 1.86 ng/dL — ABNORMAL HIGH (ref 0.82–1.77)

## 2019-11-12 LAB — COMPREHENSIVE THYROGLOBULIN
Anti-Thyroglobulin Antibodies: <1 IU/mL
Thyroglobulin (ICMA): <0.1 ng/mL

## 2019-11-12 LAB — THYROGLOBULIN ANTIBODY: Thyroglobulin Antibody: <1 IU/mL (ref 0.0–0.9)

## 2019-11-12 LAB — TSH: TSH: 0.958 u[IU]/mL (ref 0.450–4.500)

## 2019-12-01 ENCOUNTER — Ambulatory Visit: Payer: BC Managed Care – PPO | Admitting: "Endocrinology

## 2020-01-09 ENCOUNTER — Other Ambulatory Visit: Payer: Self-pay | Admitting: "Endocrinology

## 2020-03-26 ENCOUNTER — Other Ambulatory Visit: Payer: Self-pay

## 2020-03-26 ENCOUNTER — Ambulatory Visit (HOSPITAL_COMMUNITY)
Admission: RE | Admit: 2020-03-26 | Discharge: 2020-03-26 | Disposition: A | Discharge status: Home / Self Care | Payer: Medicaid Other | Source: Ambulatory Visit | Attending: "Endocrinology | Admitting: "Endocrinology

## 2020-03-26 DIAGNOSIS — C73 Malignant neoplasm of thyroid gland: Secondary | ICD-10-CM | POA: Insufficient documentation

## 2020-05-09 ENCOUNTER — Ambulatory Visit: Payer: Medicaid Other | Admitting: "Endocrinology

## 2020-05-09 LAB — T4, FREE: Free T4: 1.29 ng/dL (ref 0.82–1.77)

## 2020-05-09 LAB — TSH: TSH: 5.44 u[IU]/mL — ABNORMAL HIGH (ref 0.450–4.500)

## 2020-05-10 ENCOUNTER — Encounter: Payer: Self-pay | Admitting: "Endocrinology

## 2020-05-10 ENCOUNTER — Other Ambulatory Visit: Payer: Self-pay

## 2020-05-10 ENCOUNTER — Ambulatory Visit (INDEPENDENT_AMBULATORY_CARE_PROVIDER_SITE_OTHER): Payer: Medicaid Other | Admitting: "Endocrinology

## 2020-05-10 VITALS — BP 106/64 | HR 84 | Ht 62.0 in | Wt 184.4 lb

## 2020-05-10 DIAGNOSIS — C73 Malignant neoplasm of thyroid gland: Secondary | ICD-10-CM

## 2020-05-10 DIAGNOSIS — E890 Postprocedural hypothyroidism: Secondary | ICD-10-CM

## 2020-05-10 MED ORDER — LEVOTHYROXINE SODIUM 137 MCG PO TABS: 137.0000 ug | ORAL_TABLET | Freq: Every day | ORAL | 1 refills | Status: DC

## 2020-05-10 NOTE — Progress Notes (Signed)
05/10/2020, 12:45 PM       Endocrinology follow-up note  Subjective:    Patient ID: Amy Nelson, female    DOB: 10/18/1985, PCP Tollie Eth, NP   Past Medical History:  Diagnosis Date  . Asthma   . Cancer ([place])    Thyroid  . Diabetes mellitus without complication ([place])    Gestational    Past Surgical History:  Procedure Laterality Date  . BREAST REDUCTION SURGERY    . CESAREAN SECTION    . THYROIDECTOMY N/A 12/15/2018   Procedure: TOTAL THYROIDECTOMY;  Surgeon: Armandina Gemma, MD;  Location: [facility] OR;  Service: General;  Laterality: N/A;   Social History   Socioeconomic History  . Marital status: Single    Spouse name: Not on file  . Number of children: Not on file  . Years of education: Not on file  . Highest education level: Not on file  Occupational History  . Not on file  Tobacco Use  . Smoking status: Never Smoker  . Smokeless tobacco: Never Used  Vaping Use  . Vaping Use: Never used  Substance and Sexual Activity  . Alcohol use: No  . Drug use: No  . Sexual activity: Yes    Birth control/protection: None  Other Topics Concern  . Not on file  Social History Narrative  . Not on file   Social Determinants of Health   Financial Resource Strain: Not on file  Food Insecurity: Not on file  Transportation Needs: Not on file  Physical Activity: Not on file  Stress: Not on file  Social Connections: Not on file   Family History  Problem Relation Age of Onset  . Diabetes Mother   . Hypertension Mother   . Hypertension Father   . Hyperlipidemia Father    Outpatient Encounter Medications as of 05/10/2020  Medication Sig  . levothyroxine (SYNTHROID) 137 MCG tablet Take 1 tablet (137 mcg total) by mouth daily before breakfast.  . Prenatal Vit-Fe Fumarate-FA (PRENATAL MULTIVITAMIN) 60-1 MG tablet Take 1 tablet by mouth daily.   (Patient not taking: Reported on 05/10/2020)  . [DISCONTINUED]  levothyroxine (SYNTHROID) 125 MCG tablet Take 1 tablet (125 mcg total) by mouth daily before breakfast.   No facility-administered encounter medications on file as of 05/10/2020.   ALLERGIES: No Known Allergies  VACCINATION STATUS:  There is no immunization history on file for this patient.  HPI Amy Nelson is 35 y.o. 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  -She underwent thyroidectomy November 2020. -She underwent Thyrogen stimulated I-131 thyroid remnant ablation subsequently.   Post therapy whole-body scan was negative for distant metastases, possible thyroid remnant in the neck. Her recent thyroid/neck ultrasound on March 26, 2020 showed surgical changes consistent with total thyroidectomy without evidence of residual or recurrent disease.   She is currently on levothyroxine 125 mcg p.o. daily before breakfast.  She reports compliance with medication.  Her previsit labs are consistent with slight under replacement.   She denies palpitations, tremors, heat/cold intolerance. Her previsit labs are consistent with slight over replacement.   Her prior history is as follows: She underwent fine-needle aspiration of an nodular goiter  on November 04, 2018.  Cytology report confirmed atypia of undetermined significance.  Subsequent molecular studies confirm approximately 50% risk of malignancy.   She was sent for thyroidectomy during her second trimester pregnancy. She underwent total thyroidectomy on December 15, 2018.  Her surgical sample showed follicular neoplasm.  Outside consultation to Dr. Holly Bodily of [facility] reviewed the slides and summary was 7.7 cm left lobe mass: Grossly encapsulated, angioinvasive, follicular carcinoma with at least 2 foci of vascular invasion in tumor capsule; tumor confined to the thyroid.    -Thyrogen stimulated I-131 thyroid ablation and   Post therapy  whole-body scan performed from April 16-April 26, see below.  She has recovered from her surgery very well.  She is 7 months postpartum, not breast-feeding. She denies any family history of thyroid malignancy, however her mother has hypothyroidism on thyroid hormone supplement. She denies exposure to neck radiation.   Review of Systems Limited as above.  Objective:    BP 106/64   Pulse 84   Ht 5\' 2"  (1.575 m)   Wt 184 lb 6.4 oz (83.6 kg)   LMP 06/13/2012   BMI 33.73 kg/m   Wt Readings from Last 3 Encounters:  05/10/20 184 lb 6.4 oz (83.6 kg)  11/09/19 180 lb 9.6 oz (81.9 kg)  05/27/19 173 lb (78.5 kg)    Physical Exam  Physical Exam- Limited  Constitutional:  Body mass index is 33.73 kg/m. , not in acute distress, normal state of mind Eyes:  EOMI, no exophthalmos Neck: Supple Thyroid: + Post thyroidectomy scar with some moderate keloid formation.      CMP ( most recent) CMP     Component Value Date/Time   NA 133 (L) 12/16/2018 0534   K 3.8 12/16/2018 0534   CL 101 12/16/2018 0534   CO2 22 12/16/2018 0534   GLUCOSE 128 (H) 12/16/2018 0534   BUN <5 (L) 12/16/2018 0534   CREATININE 0.39 (L) 12/16/2018 0534   CALCIUM 8.7 (L) 12/16/2018 0534   GFRNONAA >60 12/16/2018 0534   GFRAA >60 12/16/2018 0534     Summary of her thyroid ultrasound from September 10, 2018: Right lobe 5.1 cm x 1.2 cm x 1.3 cm-no nodules. Left lobe 8.5 mm x 5.3 cm x 4.4 cm with 7.7 cm nodule observed to increase in size compared to prior study from February 2018.  Thyroid fine-needle aspiration cytology: Atypia of undetermined significance.  The smears are cellular with sheets of follicular cells, follicles and colloid.  There are scattered microfollicular aggregates present.  No papillary architecture or Hurthle cell change.  Afirma 50% risk of Malignancy   Total thyroidectomy on December 15, 2018 FINAL MICROSCOPIC DIAGNOSIS: A. THYROID, TOTAL THYROIDECTOMY: - Follicular neoplasm, pending  outside consultation - See comment COMMENT: Slides were sent for outside consultation. Outside consultation to Dr. Holly Bodily of [facility] reviewed the slides and summary was 7.7 cm left lobe mass: Grossly encapsulated, angioinvasive, follicular carcinoma with at least 2 foci of vascular invasion in tumor capsule; tumor confined to the thyroid.    Recent Results (from the past 2160 hour(s))  TSH     Status: Abnormal   Collection Time: 05/08/20 11:04 AM  Result Value Ref Range   TSH 5.440 (H) 0.450 - 4.500 uIU/mL  T4, free     Status: None   Collection Time: 05/08/20 11:04 AM  Result Value Ref Range   Free T4 1.29 0.82 - 1.77 ng/dL   Thyroid insulated I-131 thyroid remnant ablation between  April 16-3/26/2021. FINDINGS: Uptake at the thyroid bed bilaterally consistent with remnant. No additional sites of abnormal radio iodine accumulation are seen to suggest iodine-avid metastatic thyroid cancer.  Expected tracer excretion into urinary bladder.   Thyroid/neck ultrasound March 26, 2020 IMPRESSION: Thyroid remnant bilaterally. No scintigraphic evidence of iodine-avid metastatic thyroid cancer. FINDINGS: The thyroid gland is surgically absent. Evaluation of the left and right thyroid resection beds demonstrates no evidence of residual or recurrent thyroid tissue. No nodularity or suspicious lymphadenopathy.  IMPRESSION: Surgical changes of total thyroidectomy without evidence of residual or recurrent disease.   Assessment & Plan:    1.  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  Nodular goiter -resolved 3-postsurgical hypothyroidism  -She is status post total thyroidectomy, follicular carcinoma confirmed on left lobe.   She is 7 months postpartum, not breast-feeding.   Previously her recent unstimulated TG is <0.1.  She is status post thyroidectomy stimulated I-131 thyroid remnant ablation with post therapy scan  IMPRESSION: Thyroid remnant  bilaterally. No scintigraphic evidence of iodine-avid metastatic thyroid cancer.  Her previsit thyroid/neck ultrasound is unremarkable.  See above.  This completes the initial intensive phase of thyroid cancer.  She is made aware of the need for continued surveillance studies for the next 5 to 8 years including thyroid/neck ultrasound before her next visit in 6 months.   For posShe will not need any more imaging this year.  For her surgical hypothyroidism: Her pre-visit thyroid function tests are consistent with slight under replacement.  I discussed and increased her levothyroxine to 137 mcg daily before breakfast.    - We discussed about the correct intake of her thyroid hormone, on empty stomach at fasting, with water, separated by at least 30 minutes from breakfast and other medications,  and separated by more than 4 hours from calcium, iron, multivitamins, acid reflux medications (PPIs). -Patient is made aware of the fact that thyroid hormone replacement is needed for life, dose to be adjusted by periodic monitoring of thyroid function tests.   She is urged about the absolute need for close follow up for cancer surveillance, as well as post surgical hypothyroidism   - I advised her  to maintain close follow up with Tollie Eth, NP for primary care needs, as well as her OB/GYN given her pregnancy.   I spent 35 minutes in the care of the patient today including review of labs from Thyroid Function, CMP, and other relevant labs ; imaging/biopsy records (current and previous including abstractions from other facilities); face-to-face time discussing  her lab results and symptoms, medications doses, her options of short and long term treatment based on the latest standards of care / guidelines;   and documenting the encounter.  Amy Nelson  participated in the discussions, expressed understanding, and voiced agreement with the above plans.  All questions were answered to her  satisfaction. she is encouraged to contact clinic should she have any questions or concerns prior to her return visit.    Follow up plan: Return in about 4 months (around 09/09/2020) for F/U with Pre-visit Labs, [place] with Jeniya Flannigan.   Glade Lloyd, MD [facility] [address] [place], [place] [address] Phone: [phone]  Fax: [phone]     05/10/2020, 12:45 PM  This note was partially dictated with voice recognition software. Similar sounding words can be transcribed inadequately or may not  be corrected upon review.

## 2020-05-25 ENCOUNTER — Other Ambulatory Visit: Payer: Self-pay | Admitting: "Endocrinology

## 2020-05-30 NOTE — Telephone Encounter (Signed)
error 

## 2020-06-17 ENCOUNTER — Encounter (HOSPITAL_COMMUNITY): Payer: Self-pay

## 2020-06-17 ENCOUNTER — Emergency Department (HOSPITAL_COMMUNITY): Payer: No Typology Code available for payment source

## 2020-06-17 ENCOUNTER — Other Ambulatory Visit: Payer: Self-pay

## 2020-06-17 ENCOUNTER — Emergency Department (HOSPITAL_COMMUNITY)
Admission: EM | Admit: 2020-06-17 | Discharge: 2020-06-17 | Disposition: A | Discharge status: Home / Self Care | Payer: No Typology Code available for payment source | Attending: Emergency Medicine | Admitting: Emergency Medicine

## 2020-06-17 DIAGNOSIS — S199XXA Unspecified injury of neck, initial encounter: Secondary | ICD-10-CM | POA: Diagnosis present

## 2020-06-17 DIAGNOSIS — S161XXA Strain of muscle, fascia and tendon at neck level, initial encounter: Secondary | ICD-10-CM | POA: Diagnosis not present

## 2020-06-17 DIAGNOSIS — Y92410 Unspecified street and highway as the place of occurrence of the external cause: Secondary | ICD-10-CM | POA: Diagnosis not present

## 2020-06-17 DIAGNOSIS — J45909 Unspecified asthma, uncomplicated: Secondary | ICD-10-CM | POA: Insufficient documentation

## 2020-06-17 DIAGNOSIS — Z85850 Personal history of malignant neoplasm of thyroid: Secondary | ICD-10-CM | POA: Diagnosis not present

## 2020-06-17 DIAGNOSIS — E119 Type 2 diabetes mellitus without complications: Secondary | ICD-10-CM | POA: Diagnosis not present

## 2020-06-17 MED ORDER — DICLOFENAC SODIUM 1 % EX GEL: 2.0000 g | Freq: Four times a day (QID) | CUTANEOUS | 0 refills | Status: DC | PRN

## 2020-06-17 MED ORDER — METHOCARBAMOL 500 MG PO TABS: 500.0000 mg | ORAL_TABLET | Freq: Four times a day (QID) | ORAL | 0 refills | Status: DC | PRN
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
## 2020-06-19 ENCOUNTER — Telehealth: Payer: Self-pay | Admitting: "Endocrinology

## 2020-06-19 NOTE — Telephone Encounter (Signed)
No concerning finding about her thyroid which is not seen on CT due to prior surgery.

## 2020-06-19 NOTE — Telephone Encounter (Signed)
Discussed with pt, understanding voiced. 
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
## 2020-09-11 ENCOUNTER — Ambulatory Visit: Payer: Medicaid Other | Admitting: "Endocrinology

## 2020-11-27 ENCOUNTER — Other Ambulatory Visit: Payer: Self-pay

## 2020-11-27 ENCOUNTER — Other Ambulatory Visit: Payer: Self-pay | Admitting: "Endocrinology

## 2020-11-27 ENCOUNTER — Telehealth: Payer: Self-pay | Admitting: "Endocrinology

## 2020-11-27 DIAGNOSIS — C73 Malignant neoplasm of thyroid gland: Secondary | ICD-10-CM

## 2020-11-27 DIAGNOSIS — E890 Postprocedural hypothyroidism: Secondary | ICD-10-CM

## 2020-11-27 NOTE — Telephone Encounter (Signed)
Patient is needing to get blood work done and is needing updated lab orders sent to [facility]. The orders placed has expired.

## 2020-11-27 NOTE — Telephone Encounter (Signed)
Labs have been ordered

## 2020-12-12 ENCOUNTER — Encounter: Payer: Self-pay | Admitting: "Endocrinology

## 2020-12-12 ENCOUNTER — Ambulatory Visit (INDEPENDENT_AMBULATORY_CARE_PROVIDER_SITE_OTHER): Payer: Medicaid Other | Admitting: "Endocrinology

## 2020-12-12 VITALS — BP 108/78 | HR 100 | Ht 62.0 in | Wt 188.4 lb

## 2020-12-12 DIAGNOSIS — E890 Postprocedural hypothyroidism: Secondary | ICD-10-CM | POA: Diagnosis not present

## 2020-12-12 DIAGNOSIS — C73 Malignant neoplasm of thyroid gland: Secondary | ICD-10-CM | POA: Diagnosis not present

## 2020-12-12 MED ORDER — LEVOTHYROXINE SODIUM 125 MCG PO TABS: 125.0000 ug | ORAL_TABLET | Freq: Every day | ORAL | 1 refills | Status: DC

## 2020-12-12 NOTE — Progress Notes (Signed)
12/12/2020, 7:50 PM       Endocrinology follow-up note  Subjective:    Patient ID: Amy Nelson, female    DOB: 1985-11-29, PCP Tollie Eth, NP   Past Medical History:  Diagnosis Date   Asthma    Cancer ([place])    Thyroid   Diabetes mellitus without complication ([place])    Gestational    Past Surgical History:  Procedure Laterality Date   BREAST REDUCTION SURGERY     CESAREAN SECTION     THYROIDECTOMY N/A 12/15/2018   Procedure: TOTAL THYROIDECTOMY;  Surgeon: Armandina Gemma, MD;  Location: [facility] OR;  Service: General;  Laterality: N/A;   Social History   Socioeconomic History   Marital status: Single    Spouse name: Not on file   Number of children: Not on file   Years of education: Not on file   Highest education level: Not on file  Occupational History   Not on file  Tobacco Use   Smoking status: Never   Smokeless tobacco: Never  Vaping Use   Vaping Use: Never used  Substance and Sexual Activity   Alcohol use: No   Drug use: No   Sexual activity: Yes    Birth control/protection: None  Other Topics Concern   Not on file  Social History Narrative   Not on file   Social Determinants of Health   Financial Resource Strain: Not on file  Food Insecurity: Not on file  Transportation Needs: Not on file  Physical Activity: Not on file  Stress: Not on file  Social Connections: Not on file   Family History  Problem Relation Age of Onset   Diabetes Mother    Hypertension Mother    Hypertension Father    Hyperlipidemia Father    Outpatient Encounter Medications as of 12/12/2020  Medication Sig   diclofenac Sodium (VOLTAREN) 1 % GEL Apply 2 g topically 4 (four) times daily as needed. (Patient not taking: Reported on 12/12/2020)   levothyroxine (SYNTHROID) 125 MCG tablet Take 1 tablet (125 mcg total) by mouth daily before breakfast.   methocarbamol (ROBAXIN) 500 MG tablet Take 1 tablet (500 mg total) by  mouth every 6 (six) hours as needed for muscle spasms. (Patient not taking: Reported on 12/12/2020)   [DISCONTINUED] levothyroxine (SYNTHROID) 137 MCG tablet Take 1 tablet (137 mcg total) by mouth daily before breakfast.   [DISCONTINUED] levothyroxine (SYNTHROID) 137 MCG tablet TAKE (1) TABLET BY MOUTH DAILY BEFORE BREAKFAST.   [DISCONTINUED] Prenatal Vit-Fe Fumarate-FA (PRENATAL MULTIVITAMIN) 60-1 MG tablet Take 1 tablet by mouth daily.   (Patient not taking: Reported on 05/10/2020)   No facility-administered encounter medications on file as of 12/12/2020.   ALLERGIES: No Known Allergies  VACCINATION STATUS:  There is no immunization history on file for this patient.  HPI Amy Nelson is 35 y.o. 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  -She underwent thyroidectomy November 2020. -She underwent Thyrogen stimulated I-131 thyroid remnant ablation subsequently.   Post therapy whole-body scan was negative for distant metastases, possible thyroid remnant in the neck. Her recent thyroid/neck ultrasound on March 26, 2020 showed surgical changes consistent with total thyroidectomy  without evidence of residual or recurrent disease.   She is currently on levothyroxine 125 mcg p.o. daily before breakfast.  She reports compliance with medication.  Her previsit labs are consistent with slight under replacement.   She denies palpitations, tremors, heat/cold intolerance. Her previsit labs are consistent with slight over replacement.   Her prior history is as follows: She underwent fine-needle aspiration of an nodular goiter on November 04, 2018.  Cytology report confirmed atypia of undetermined significance.  Subsequent molecular studies confirm approximately 50% risk of malignancy.   She was sent for thyroidectomy during her second trimester pregnancy. She underwent total thyroidectomy on December 15, 2018.  Her  surgical sample showed follicular neoplasm.  Outside consultation to Dr. Holly Bodily of [facility] reviewed the slides and summary was 7.7 cm left lobe mass: Grossly encapsulated, angioinvasive, follicular carcinoma with at least 2 foci of vascular invasion in tumor capsule; tumor confined to the thyroid.    -Thyrogen stimulated I-131 thyroid ablation and   Post therapy whole-body scan performed from April 16-April 26, see below.    She has recovered from her surgery very well, except for surgical site keloid. She remains on levothyroxine 137 mcg p.o. daily.  Her previsit thyroid labs are consistent with slight over replacement.    She denies any family history of thyroid malignancy, however her mother has hypothyroidism on thyroid hormone supplement. She denies exposure to neck radiation.   Review of Systems Limited as above.  Objective:    BP 108/78   Pulse 100   Ht 5\' 2"  (1.575 m)   Wt 188 lb 6.4 oz (85.5 kg)   LMP 06/13/2012   BMI 34.46 kg/m   Wt Readings from Last 3 Encounters:  12/12/20 188 lb 6.4 oz (85.5 kg)  06/17/20 184 lb (83.5 kg)  05/10/20 184 lb 6.4 oz (83.6 kg)    Physical Exam  Physical Exam- Limited  Constitutional:  Body mass index is 34.46 kg/m. , not in acute distress, normal state of mind Eyes:  EOMI, no exophthalmos Neck: Supple Thyroid: + Post thyroidectomy scar with some moderate keloid formation.      CMP ( most recent) CMP     Component Value Date/Time   NA 133 (L) 12/16/2018 0534   K 3.8 12/16/2018 0534   CL 101 12/16/2018 0534   CO2 22 12/16/2018 0534   GLUCOSE 128 (H) 12/16/2018 0534   BUN <5 (L) 12/16/2018 0534   CREATININE 0.39 (L) 12/16/2018 0534   CALCIUM 8.7 (L) 12/16/2018 0534   GFRNONAA >60 12/16/2018 0534   GFRAA >60 12/16/2018 0534     Summary of her thyroid ultrasound from September 10, 2018: Right lobe 5.1 cm x 1.2 cm x 1.3 cm-no nodules. Left lobe 8.5 mm x 5.3 cm x 4.4 cm with 7.7 cm nodule observed to  increase in size compared to prior study from February 2018.  Thyroid fine-needle aspiration cytology: Atypia of undetermined significance.  The smears are cellular with sheets of follicular cells, follicles and colloid.  There are scattered microfollicular aggregates present.  No papillary architecture or Hurthle cell change.  Afirma 50% risk of Malignancy   Total thyroidectomy on December 15, 2018 FINAL MICROSCOPIC DIAGNOSIS: A. THYROID, TOTAL THYROIDECTOMY: - Follicular neoplasm, pending outside consultation - See comment COMMENT: Slides were sent for outside consultation. Outside consultation to Dr. Holly Bodily of [facility] reviewed the slides and summary was 7.7 cm left lobe mass: Grossly encapsulated, angioinvasive, follicular carcinoma with at least 2  foci of vascular invasion in tumor capsule; tumor confined to the thyroid.    Recent Results (from the past 2160 hour(s))  TSH     Status: Abnormal   Collection Time: 12/06/20  1:24 PM  Result Value Ref Range   TSH 0.043 (L) 0.450 - 4.500 uIU/mL  T4, Free     Status: None   Collection Time: 12/06/20  1:24 PM  Result Value Ref Range   Free T4 1.70 0.82 - 1.77 ng/dL  Thyroglobulin antibody     Status: None   Collection Time: 12/06/20  1:24 PM  Result Value Ref Range   Thyroglobulin Antibody <1.0 0.0 - 0.9 IU/mL    Comment: Thyroglobulin Antibody measured by Beckman Coulter Methodology  Thyroglobulin Level     Status: None (Preliminary result)   Collection Time: 12/06/20  1:24 PM  Result Value Ref Range   Thyroglobulin (TG-RIA) WILL FOLLOW    Thyroid insulated I-131 thyroid remnant ablation between April 16-3/26/2021. FINDINGS: Uptake at the thyroid bed bilaterally consistent with remnant.  No additional sites of abnormal radio iodine accumulation are seen to suggest iodine-avid metastatic thyroid cancer.   Expected tracer excretion into urinary bladder.    Thyroid/neck ultrasound March 26, 2020 IMPRESSION: Thyroid remnant bilaterally.  No scintigraphic evidence of iodine-avid metastatic thyroid cancer.  FINDINGS: The thyroid gland is surgically absent. Evaluation of the left and right thyroid resection beds demonstrates no evidence of residual or recurrent thyroid tissue. No nodularity or suspicious lymphadenopathy.   IMPRESSION: Surgical changes of total thyroidectomy without evidence of residual or recurrent disease.   Assessment & Plan:    1.  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  Nodular goiter -resolved 3-postsurgical hypothyroidism  -She is status post total thyroidectomy, follicular carcinoma confirmed on left lobe.    Previously her recent unstimulated TG is <0.1.  She is status post thyroidectomy stimulated I-131 thyroid remnant ablation with post therapy scan   IMPRESSION: Thyroid remnant bilaterally.  No scintigraphic evidence of iodine-avid metastatic thyroid cancer.  Her previsit thyroid/neck ultrasound is unremarkable.  See above.  This completes the initial intensive phase of thyroid cancer.  She is made aware of the need for continued surveillance studies for the next 5 to 8 years including thyroid/neck ultrasound before her next visit in 6 months.  She will not need any more imaging this year, however she will be considered for Thyrogen stimulated whole-body scan after her next visit in 6 months.  For her surgical hypothyroidism: Her pre-visit thyroid function tests are consistent with slight over replacement.  I discussed and lowered her levothyroxine to 125 mcg p.o. daily before breakfast.   - We discussed about the correct intake of her thyroid hormone, on empty stomach at fasting, with water, separated by at least 30 minutes from breakfast and other medications,  and separated by more than 4 hours from calcium, iron, multivitamins, acid reflux medications (PPIs). -Patient is made aware of the fact that thyroid hormone replacement is  needed for life, dose to be adjusted by periodic monitoring of thyroid function tests.   She is urged about the absolute need for close follow up for cancer surveillance, as well as post surgical hypothyroidism   - I advised her  to maintain close follow up with Tollie Eth, NP for primary care needs, as well as her OB/GYN given her pregnancy.   I spent 30 minutes in the care of the patient today including review of labs from Thyroid Function, CMP, and other relevant  labs ; imaging/biopsy records (current and previous including abstractions from other facilities); face-to-face time discussing  her lab results and symptoms, medications doses, her options of short and long term treatment based on the latest standards of care / guidelines;   and documenting the encounter.  Yavonne D Guthmiller  participated in the discussions, expressed understanding, and voiced agreement with the above plans.  All questions were answered to her satisfaction. she is encouraged to contact clinic should she have any questions or concerns prior to her return visit.     Follow up plan: Return in about 6 months (around 06/11/2021) for F/U with Pre-visit Labs.   Glade Lloyd, MD [facility] [address] [place], [place] [address] Phone: [phone]  Fax: [phone]     12/12/2020, 7:50 PM  This note was partially dictated with voice recognition software. Similar sounding words can be transcribed inadequately or may not  be corrected upon review.

## 2020-12-16 LAB — THYROGLOBULIN ANTIBODY: Thyroglobulin Antibody: <1 IU/mL (ref 0.0–0.9)

## 2020-12-16 LAB — T4, FREE: Free T4: 1.7 ng/dL (ref 0.82–1.77)

## 2020-12-16 LAB — THYROGLOBULIN LEVEL: Thyroglobulin (TG-RIA): <2 ng/mL

## 2020-12-16 LAB — TSH: TSH: 0.043 u[IU]/mL — ABNORMAL LOW (ref 0.450–4.500)

## 2021-04-09 ENCOUNTER — Telehealth: Payer: Self-pay | Admitting: "Endocrinology

## 2021-04-09 NOTE — Telephone Encounter (Signed)
Pt states you have been having her do a CT everywhere around this time. She is scheduled to come back in May with blood work. She wanted me to send you a message to see if you wanted to order that. ?

## 2021-04-09 NOTE — Telephone Encounter (Signed)
Informed pt .

## 2021-05-10 DIAGNOSIS — R5383 Other fatigue: Secondary | ICD-10-CM

## 2021-05-10 DIAGNOSIS — R0683 Snoring: Secondary | ICD-10-CM

## 2021-06-07 LAB — T4, FREE: Free T4: 1.73 ng/dL (ref 0.82–1.77)

## 2021-06-07 LAB — TSH: TSH: 0.187 u[IU]/mL — ABNORMAL LOW (ref 0.450–4.500)

## 2021-06-10 ENCOUNTER — Encounter: Payer: Self-pay | Admitting: "Endocrinology

## 2021-06-10 ENCOUNTER — Ambulatory Visit (INDEPENDENT_AMBULATORY_CARE_PROVIDER_SITE_OTHER): Payer: Medicaid Other | Admitting: "Endocrinology

## 2021-06-10 VITALS — BP 110/87 | HR 88 | Ht 62.0 in | Wt 187.0 lb

## 2021-06-10 DIAGNOSIS — E890 Postprocedural hypothyroidism: Secondary | ICD-10-CM

## 2021-06-10 DIAGNOSIS — C73 Malignant neoplasm of thyroid gland: Secondary | ICD-10-CM

## 2021-06-10 MED ORDER — LEVOTHYROXINE SODIUM 112 MCG PO TABS: 112.0000 ug | ORAL_TABLET | Freq: Every day | ORAL | 1 refills | Status: DC

## 2021-06-10 NOTE — Progress Notes (Signed)
06/10/2021, 1:33 PM      Endocrinology follow-up note  Subjective:    Patient ID: NORIAH Nelson, female    DOB: Sep 12, 1985, PCP Tollie Eth, NP   Past Medical History:  Diagnosis Date   Asthma    Cancer ([place])    Thyroid   Diabetes mellitus without complication ([place])    Gestational    Past Surgical History:  Procedure Laterality Date   BREAST REDUCTION SURGERY     CESAREAN SECTION     THYROIDECTOMY N/A 12/15/2018   Procedure: TOTAL THYROIDECTOMY;  Surgeon: Armandina Gemma, MD;  Location: [facility] OR;  Service: General;  Laterality: N/A;   Social History   Socioeconomic History   Marital status: Single    Spouse name: Not on file   Number of children: Not on file   Years of education: Not on file   Highest education level: Not on file  Occupational History   Not on file  Tobacco Use   Smoking status: Never   Smokeless tobacco: Never  Vaping Use   Vaping Use: Never used  Substance and Sexual Activity   Alcohol use: No   Drug use: No   Sexual activity: Yes    Birth control/protection: None  Other Topics Concern   Not on file  Social History Narrative   Not on file   Social Determinants of Health   Financial Resource Strain: Not on file  Food Insecurity: Not on file  Transportation Needs: Not on file  Physical Activity: Not on file  Stress: Not on file  Social Connections: Not on file   Family History  Problem Relation Age of Onset   Diabetes Mother    Hypertension Mother    Hypertension Father    Hyperlipidemia Father    Outpatient Encounter Medications as of 06/10/2021  Medication Sig   levothyroxine (SYNTHROID) 112 MCG tablet Take 1 tablet (112 mcg total) by mouth daily before breakfast.   topiramate (TOPAMAX) 25 MG tablet Take 2 tablets by mouth 2 (two) times daily.   [DISCONTINUED] diclofenac Sodium (VOLTAREN) 1 % GEL Apply 2 g topically 4 (four) times daily as needed. (Patient not taking:  Reported on 12/12/2020)   [DISCONTINUED] levothyroxine (SYNTHROID) 125 MCG tablet Take 1 tablet (125 mcg total) by mouth daily before breakfast.   [DISCONTINUED] methocarbamol (ROBAXIN) 500 MG tablet Take 1 tablet (500 mg total) by mouth every 6 (six) hours as needed for muscle spasms. (Patient not taking: Reported on 12/12/2020)   No facility-administered encounter medications on file as of 06/10/2021.   ALLERGIES: No Known Allergies  VACCINATION STATUS:  There is no immunization history on file for this patient.  HPI Amy Nelson is 36 y.o. 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  -She underwent thyroidectomy November 2020. -She underwent Thyrogen stimulated I-131 thyroid remnant ablation subsequently.   Post therapy whole-body scan was negative for distant metastases, possible thyroid remnant in the neck. Her recent thyroid/neck ultrasound on March 26, 2020 showed surgical changes consistent with total thyroidectomy without evidence of residual or recurrent disease.   She is currently on levothyroxine 125 mcg p.o. daily before breakfast.   She reports  compliance with medication.  Her previsit labs are consistent with slight over-replacement.     She denies palpitations, tremors, heat/cold intolerance. Her previsit labs are consistent with slight over replacement.   Her prior history is as follows: She underwent fine-needle aspiration of an nodular goiter on November 04, 2018.  Cytology report confirmed atypia of undetermined significance.  Subsequent molecular studies confirm approximately 50% risk of malignancy.   She was sent for thyroidectomy during her second trimester pregnancy. She underwent total thyroidectomy on December 15, 2018.  Her surgical sample showed follicular neoplasm.  Outside consultation to Dr. Holly Nelson of [facility] reviewed the slides and summary was 7.7 cm left  lobe mass: Grossly encapsulated, angioinvasive, follicular carcinoma with at least 2 foci of vascular invasion in tumor capsule; tumor confined to the thyroid.    -Thyrogen stimulated I-131 thyroid ablation and   Post therapy whole-body scan performed from April 16-April 26, see below.    She has recovered from her surgery very well, except for surgical site keloid.    She denies any family history of thyroid malignancy, however her mother has hypothyroidism on thyroid hormone supplement. She denies exposure to neck radiation.   Review of Systems Limited as above.  Objective:    BP 110/87   Pulse 88   Ht '5\' 2"'$  (1.575 m)   Wt 187 lb (84.8 kg)   LMP 06/13/2012   BMI 34.20 kg/m   Wt Readings from Last 3 Encounters:  06/10/21 187 lb (84.8 kg)  12/12/20 188 lb 6.4 oz (85.5 kg)  06/17/20 184 lb (83.5 kg)    Physical Exam  Physical Exam- Limited  Constitutional:  Body mass index is 34.2 kg/m. , not in acute distress, normal state of mind Eyes:  EOMI, no exophthalmos Neck: Supple Thyroid: + Post thyroidectomy scar with some moderate keloid formation.      CMP ( most recent) CMP     Component Value Date/Time   NA 133 (L) 12/16/2018 0534   K 3.8 12/16/2018 0534   CL 101 12/16/2018 0534   CO2 22 12/16/2018 0534   GLUCOSE 128 (H) 12/16/2018 0534   BUN <5 (L) 12/16/2018 0534   CREATININE 0.39 (L) 12/16/2018 0534   CALCIUM 8.7 (L) 12/16/2018 0534   GFRNONAA >60 12/16/2018 0534   GFRAA >60 12/16/2018 0534     Summary of her thyroid ultrasound from September 10, 2018: Right lobe 5.1 cm x 1.2 cm x 1.3 cm-no nodules. Left lobe 8.5 mm x 5.3 cm x 4.4 cm with 7.7 cm nodule observed to increase in size compared to prior study from February 2018.  Thyroid fine-needle aspiration cytology: Atypia of undetermined significance.  The smears are cellular with sheets of follicular cells, follicles and colloid.  There are scattered microfollicular aggregates present.  No papillary  architecture or Hurthle cell change.  Afirma 50% risk of Malignancy   Total thyroidectomy on December 15, 2018 FINAL MICROSCOPIC DIAGNOSIS: A. THYROID, TOTAL THYROIDECTOMY: - Follicular neoplasm, pending outside consultation - See comment COMMENT: Slides were sent for outside consultation. Outside consultation to Dr. Holly Nelson of [facility] reviewed the slides and summary was 7.7 cm left lobe mass: Grossly encapsulated, angioinvasive, follicular carcinoma with at least 2 foci of vascular invasion in tumor capsule; tumor confined to the thyroid.    Recent Results (from the past 2160 hour(s))  TSH     Status: Abnormal   Collection Time: 06/06/21 11:50 AM  Result Value Ref Range   TSH  0.187 (L) 0.450 - 4.500 uIU/mL  T4, free     Status: None   Collection Time: 06/06/21 11:50 AM  Result Value Ref Range   Free T4 1.73 0.82 - 1.77 ng/dL   Thyroid insulated I-131 thyroid remnant ablation between April 16-3/26/2021. FINDINGS: Uptake at the thyroid bed bilaterally consistent with remnant.  No additional sites of abnormal radio iodine accumulation are seen to suggest iodine-avid metastatic thyroid cancer.   Expected tracer excretion into urinary bladder.    Thyroid/neck ultrasound March 26, 2020 IMPRESSION: Thyroid remnant bilaterally.  No scintigraphic evidence of iodine-avid metastatic thyroid cancer.  FINDINGS: The thyroid gland is surgically absent. Evaluation of the left and right thyroid resection beds demonstrates no evidence of residual or recurrent thyroid tissue. No nodularity or suspicious lymphadenopathy.   IMPRESSION: Surgical changes of total thyroidectomy without evidence of residual or recurrent disease.   Assessment & Plan:    1.  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  Nodular goiter -resolved 3-postsurgical hypothyroidism  -She is status post total thyroidectomy, follicular carcinoma confirmed on left lobe.    Previously her recent  unstimulated TG is <0.1.  She is status post thyroidectomy stimulated I-131 thyroid remnant ablation with post therapy scan   IMPRESSION: Thyroid remnant bilaterally.  No scintigraphic evidence of iodine-avid metastatic thyroid cancer.  Her previsit thyroid/neck ultrasound is unremarkable.  See above.  This completes the initial intensive phase of thyroid cancer.  She is made aware of the need for continued surveillance studies for the next 5 to 8 years including thyroid/neck ultrasound before her next visit in 6 months.  She will be considered for Thyrogen stimulated whole body skin can after her next visit.    For her surgical hypothyroidism: Her pre-visit thyroid function tests are consistent with slight over replacement.  I discussed and lowered her levothyroxine 112 mcg p.o. daily before breakfast.    - We discussed about the correct intake of her thyroid hormone, on empty stomach at fasting, with water, separated by at least 30 minutes from breakfast and other medications,  and separated by more than 4 hours from calcium, iron, multivitamins, acid reflux medications (PPIs). -Patient is made aware of the fact that thyroid hormone replacement is needed for life, dose to be adjusted by periodic monitoring of thyroid function tests.  She is urged about the absolute need for close follow up for cancer surveillance, as well as post surgical hypothyroidism   - I advised her  to maintain close follow up with Tollie Eth, NP for primary care needs, as well as her OB/GYN given her pregnancy.   I spent 21 minutes in the care of the patient today including review of labs from Thyroid Function, CMP, and other relevant labs ; imaging/biopsy records (current and previous including abstractions from other facilities); face-to-face time discussing  her lab results and symptoms, medications doses, her options of short and long term treatment based on the latest standards of care / guidelines;   and  documenting the encounter.  Elanna D Conaway  participated in the discussions, expressed understanding, and voiced agreement with the above plans.  All questions were answered to her satisfaction. she is encouraged to contact clinic should she have any questions or concerns prior to her return visit.   Follow up plan: Return in about 6 months (around 12/11/2021) for F/U with Pre-visit Labs.   Glade Lloyd, MD [facility] [address] [place], [place] [address] Phone: [phone]  Fax:  [phone]     06/10/2021, 1:33 PM  This note was partially dictated with voice recognition software. Similar sounding words can be transcribed inadequately or may not  be corrected upon review.

## 2021-06-11 ENCOUNTER — Ambulatory Visit: Payer: Medicaid Other | Admitting: "Endocrinology

## 2021-11-16 IMAGING — NM NM [ID] THYROID CANCER METS SP CA TX
4 series · 4 of 4 positions shown · non-contrast
Comparison: None.

CLINICAL DATA: Thyroid cancer post thyroidectomy and postoperative
radioactive iodine adjuvant therapy

EXAM:
NUCLEAR MEDICINE B-UTU POST THERAPY WHOLE BODY SCAN
TECHNIQUE: The patient received 121 mCi B-UTU sodium iodide for the treatment
of thyroid cancer within the past 10 days. The patient returns
today, and whole body scanning was performed in the anterior and
posterior projections.

[Series 1: i131 whole body · 2.66mm/px · 1 of 1 slices shown (1 of 2)]
[im 1/1  full-range]
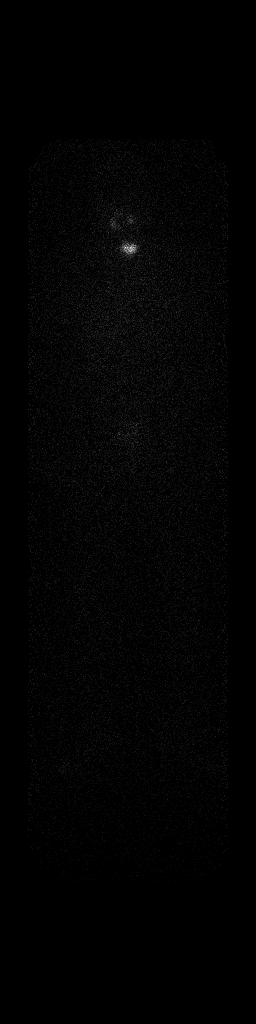

[Series 1: i131 whole body · 2.66mm/px · 1 of 1 slices shown (2 of 2)]
[im 1/1  full-range]
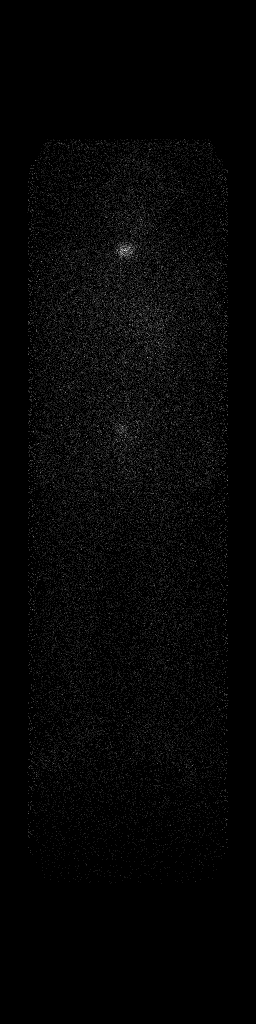

[Series 2: static with marker · 4.14mm/px · 1 of 1 slices shown]
[im 1/1]
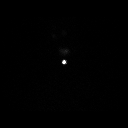

[Series 3: static without marker · non-contrast · 4.14mm/px · 1 of 1 slices shown]
[im 1/1]
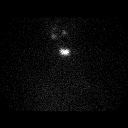

[4 of 4 positions shown; findings below may reference images not displayed]

FINDINGS: Uptake at the thyroid bed bilaterally consistent with remnant.

No additional sites of abnormal radio iodine accumulation are seen
to suggest iodine-avid metastatic thyroid cancer.

Expected tracer excretion into urinary bladder.
IMPRESSION: Thyroid remnant bilaterally.

No scintigraphic evidence of iodine-avid metastatic thyroid cancer.

## 2021-12-11 ENCOUNTER — Ambulatory Visit: Payer: Medicaid Other | Admitting: "Endocrinology

## 2021-12-12 LAB — TSH: TSH: 10.2 u[IU]/mL — ABNORMAL HIGH (ref 0.450–4.500)

## 2021-12-12 LAB — THYROGLOBULIN LEVEL: Thyroglobulin (TG-RIA): <2 ng/mL

## 2021-12-12 LAB — T4, FREE: Free T4: 1.35 ng/dL (ref 0.82–1.77)

## 2021-12-13 ENCOUNTER — Other Ambulatory Visit: Payer: Self-pay | Admitting: "Endocrinology

## 2022-01-22 ENCOUNTER — Other Ambulatory Visit: Payer: Self-pay

## 2022-01-22 DIAGNOSIS — E890 Postprocedural hypothyroidism: Secondary | ICD-10-CM

## 2022-01-22 MED ORDER — LEVOTHYROXINE SODIUM 112 MCG PO TABS: 112.0000 ug | ORAL_TABLET | Freq: Every day | ORAL | 0 refills | Status: DC

## 2022-02-12 ENCOUNTER — Ambulatory Visit (INDEPENDENT_AMBULATORY_CARE_PROVIDER_SITE_OTHER): Payer: Medicaid Other | Admitting: "Endocrinology

## 2022-02-12 ENCOUNTER — Encounter: Payer: Self-pay | Admitting: "Endocrinology

## 2022-02-12 VITALS — BP 120/78 | HR 68 | Ht 62.0 in | Wt 186.2 lb

## 2022-02-12 DIAGNOSIS — C73 Malignant neoplasm of thyroid gland: Secondary | ICD-10-CM

## 2022-02-12 DIAGNOSIS — E890 Postprocedural hypothyroidism: Secondary | ICD-10-CM | POA: Diagnosis not present

## 2022-02-12 MED ORDER — LEVOTHYROXINE SODIUM 125 MCG PO TABS: 125.0000 ug | ORAL_TABLET | Freq: Every day | ORAL | 1 refills | Status: DC

## 2022-02-12 NOTE — Progress Notes (Unsigned)
02/12/2022, 9:52 AM      Endocrinology follow-up note  Subjective:    Patient ID: Amy Nelson, female    DOB: 1985-12-28, PCP Tollie Eth, NP   Past Medical History:  Diagnosis Date  . Asthma   . Cancer ([place])    Thyroid  . Diabetes mellitus without complication ([place])    Gestational    Past Surgical History:  Procedure Laterality Date  . BREAST REDUCTION SURGERY    . CESAREAN SECTION    . THYROIDECTOMY N/A 12/15/2018   Procedure: TOTAL THYROIDECTOMY;  Surgeon: Armandina Gemma, MD;  Location: [facility] OR;  Service: General;  Laterality: N/A;   Social History   Socioeconomic History  . Marital status: Single    Spouse name: Not on file  . Number of children: Not on file  . Years of education: Not on file  . Highest education level: Not on file  Occupational History  . Not on file  Tobacco Use  . Smoking status: Never  . Smokeless tobacco: Never  Vaping Use  . Vaping Use: Never used  Substance and Sexual Activity  . Alcohol use: No  . Drug use: No  . Sexual activity: Yes    Birth control/protection: None  Other Topics Concern  . Not on file  Social History Narrative  . Not on file   Social Determinants of Health   Financial Resource Strain: Not on file  Food Insecurity: Not on file  Transportation Needs: Not on file  Physical Activity: Not on file  Stress: Not on file  Social Connections: Not on file   Family History  Problem Relation Age of Onset  . Diabetes Mother   . Hypertension Mother   . Hypertension Father   . Hyperlipidemia Father    Outpatient Encounter Medications as of 02/12/2022  Medication Sig  . levothyroxine (SYNTHROID) 125 MCG tablet Take 1 tablet (125 mcg total) by mouth daily before breakfast.  . topiramate (TOPAMAX) 25 MG tablet Take 2 tablets by mouth 2 (two) times daily.  . [DISCONTINUED] levothyroxine (SYNTHROID) 112 MCG tablet Take 1 tablet (112 mcg total) by mouth daily  before breakfast.   No facility-administered encounter medications on file as of 02/12/2022.   ALLERGIES: No Known Allergies  VACCINATION STATUS:  There is no immunization history on file for this patient.  HPI Amy Nelson is 37 y.o. 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  -She underwent thyroidectomy November 2020. -She underwent Thyrogen stimulated I-131 thyroid remnant ablation subsequently.   Post therapy whole-body scan was negative for distant metastases, possible thyroid remnant in the neck. Her recent thyroid/neck ultrasound on March 26, 2020 showed surgical changes consistent with total thyroidectomy without evidence of residual or recurrent disease.   She is currently on levothyroxine 125 mcg p.o. daily before breakfast.   She reports compliance with medication.  Her previsit labs are consistent with slight over-replacement.     She denies palpitations, tremors, heat/cold intolerance. Her previsit labs are consistent with slight over replacement.   Her prior history is as follows: She underwent fine-needle aspiration of an nodular goiter on November 04, 2018.  Cytology report confirmed atypia  of undetermined significance.  Subsequent molecular studies confirm approximately 50% risk of malignancy.   She was sent for thyroidectomy during her second trimester pregnancy. She underwent total thyroidectomy on December 15, 2018.  Her surgical sample showed follicular neoplasm.  Outside consultation to Dr. Holly Bodily of [facility] reviewed the slides and summary was 7.7 cm left lobe mass: Grossly encapsulated, angioinvasive, follicular carcinoma with at least 2 foci of vascular invasion in tumor capsule; tumor confined to the thyroid.    -Thyrogen stimulated I-131 thyroid ablation and   Post therapy whole-body scan performed from April 16-April 26, see below.    She has recovered  from her surgery very well, except for surgical site keloid.    She denies any family history of thyroid malignancy, however her mother has hypothyroidism on thyroid hormone supplement. She denies exposure to neck radiation.   Review of Systems Limited as above.  Objective:    BP 120/78   Pulse 68   Ht '5\' 2"'$  (1.575 m)   Wt 186 lb 3.2 oz (84.5 kg)   LMP 06/13/2012   BMI 34.06 kg/m   Wt Readings from Last 3 Encounters:  02/12/22 186 lb 3.2 oz (84.5 kg)  06/10/21 187 lb (84.8 kg)  12/12/20 188 lb 6.4 oz (85.5 kg)    Physical Exam  Physical Exam- Limited  Constitutional:  Body mass index is 34.06 kg/m. , not in acute distress, normal state of mind Eyes:  EOMI, no exophthalmos Neck: Supple Thyroid: + Post thyroidectomy scar with some moderate keloid formation.      CMP ( most recent) CMP     Component Value Date/Time   NA 133 (L) 12/16/2018 0534   K 3.8 12/16/2018 0534   CL 101 12/16/2018 0534   CO2 22 12/16/2018 0534   GLUCOSE 128 (H) 12/16/2018 0534   BUN <5 (L) 12/16/2018 0534   CREATININE 0.39 (L) 12/16/2018 0534   CALCIUM 8.7 (L) 12/16/2018 0534   GFRNONAA >60 12/16/2018 0534   GFRAA >60 12/16/2018 0534     Summary of her thyroid ultrasound from September 10, 2018: Right lobe 5.1 cm x 1.2 cm x 1.3 cm-no nodules. Left lobe 8.5 mm x 5.3 cm x 4.4 cm with 7.7 cm nodule observed to increase in size compared to prior study from February 2018.  Thyroid fine-needle aspiration cytology: Atypia of undetermined significance.  The smears are cellular with sheets of follicular cells, follicles and colloid.  There are scattered microfollicular aggregates present.  No papillary architecture or Hurthle cell change.  Afirma 50% risk of Malignancy   Total thyroidectomy on December 15, 2018 FINAL MICROSCOPIC DIAGNOSIS: A. THYROID, TOTAL THYROIDECTOMY: - Follicular neoplasm, pending outside consultation - See comment COMMENT: Slides were sent for outside  consultation. Outside consultation to Dr. Holly Bodily of [facility] reviewed the slides and summary was 7.7 cm left lobe mass: Grossly encapsulated, angioinvasive, follicular carcinoma with at least 2 foci of vascular invasion in tumor capsule; tumor confined to the thyroid.    Recent Results (from the past 2160 hour(s))  TSH     Status: Abnormal   Collection Time: 12/05/21  1:21 PM  Result Value Ref Range   TSH 10.200 (H) 0.450 - 4.500 uIU/mL  T4, free     Status: None   Collection Time: 12/05/21  1:21 PM  Result Value Ref Range   Free T4 1.35 0.82 - 1.77 ng/dL  Thyroglobulin Level     Status: None   Collection Time: 12/05/21  1:21 PM  Result Value Ref Range   Thyroglobulin (TG-RIA) <2.0 ng/mL    Comment: This test was developed and its performance characteristics determined by [facility]. It has not been cleared or approved by the Food and Drug Administration. Reference Range: Pubertal Children and Adults: <40 According to the [facility] of Clinical Biochemistry, the reference interval for Thyroglobulin (TG) should be related to euthyroid patients and not for patients who underwent thyroidectomy.  TG reference intervals for these patients depend on the residual mass of the thyroid tissue left after surgery.  Establishing a post-operative baseline is recommended.  The assay quantitation limit is 2.0 ng/mL.    Thyroid insulated I-131 thyroid remnant ablation between April 16-3/26/2021. FINDINGS: Uptake at the thyroid bed bilaterally consistent with remnant.  No additional sites of abnormal radio iodine accumulation are seen to suggest iodine-avid metastatic thyroid cancer.   Expected tracer excretion into urinary bladder.    Thyroid/neck ultrasound March 26, 2020 IMPRESSION: Thyroid remnant bilaterally.  No scintigraphic evidence of iodine-avid metastatic thyroid cancer.  FINDINGS: The thyroid gland is surgically absent. Evaluation of the left and right  thyroid resection beds demonstrates no evidence of residual or recurrent thyroid tissue. No nodularity or suspicious lymphadenopathy.   IMPRESSION: Surgical changes of total thyroidectomy without evidence of residual or recurrent disease.   Assessment & Plan:    1.  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  Nodular goiter -resolved 3-postsurgical hypothyroidism  -She is status post total thyroidectomy, follicular carcinoma confirmed on left lobe.    Previously her recent unstimulated TG is <0.1.  She is status post thyroidectomy stimulated I-131 thyroid remnant ablation with post therapy scan   IMPRESSION: Thyroid remnant bilaterally.  No scintigraphic evidence of iodine-avid metastatic thyroid cancer.  Her previsit thyroid/neck ultrasound is unremarkable.  See above.  This completes the initial intensive phase of thyroid cancer.  She is made aware of the need for continued surveillance studies for the next 5 to 8 years including thyroid/neck ultrasound before her next visit in 6 months.  She will be considered for Thyrogen stimulated whole body skin can after her next visit.    For her surgical hypothyroidism: Her pre-visit thyroid function tests are consistent with slight over replacement.  I discussed and lowered her levothyroxine 112 mcg p.o. daily before breakfast.    - We discussed about the correct intake of her thyroid hormone, on empty stomach at fasting, with water, separated by at least 30 minutes from breakfast and other medications,  and separated by more than 4 hours from calcium, iron, multivitamins, acid reflux medications (PPIs). -Patient is made aware of the fact that thyroid hormone replacement is needed for life, dose to be adjusted by periodic monitoring of thyroid function tests.  She is urged about the absolute need for close follow up for cancer surveillance, as well as post surgical hypothyroidism   - I advised her  to maintain close follow up with  Tollie Eth, NP for primary care needs, as well as her OB/GYN given her pregnancy.   I spent 21 minutes in the care of the patient today including review of labs from Thyroid Function, CMP, and other relevant labs ; imaging/biopsy records (current and previous including abstractions from other facilities); face-to-face time discussing  her lab results and symptoms, medications doses, her options of short and long term treatment based on the latest standards of care / guidelines;   and documenting the encounter.  Sheryll D Hreha  participated in the discussions, expressed  understanding, and voiced agreement with the above plans.  All questions were answered to her satisfaction. she is encouraged to contact clinic should she have any questions or concerns prior to her return visit.   Follow up plan: Return in about 6 months (around 08/13/2022) for F/U with Pre-visit Labs, F/U with Whole Body Scan w/Thyrogen.   Glade Lloyd, MD [facility] [address] [place], [place] [address] Phone: [phone]  Fax: [phone]     02/12/2022, 9:52 AM  This note was partially dictated with voice recognition software. Similar sounding words can be transcribed inadequately or may not  be corrected upon review.

## 2022-02-13 NOTE — Written Directive (Addendum)
MOLECULAR IMAGING AND THERAPEUTICS WRITTEN DIRECTIVE   PATIENT NAME: Amy Nelson  PT DOB:   Dec 31, 1985                                              MRN: [id number]  ---------------------------------------------------------------------------------------------------------------------  I-131 WHOLE BODY SCAN    RADIOPHARMACEUTICAL: Iodine-131 Capsule for Diagnostic Imaging   PRESCRIBED DOSE FOR ADMINISTRATION:  4 mCi   ROUTE OFADMINISTRATION: PO   DIAGNOSIS:  Thyroid Cancer   REFERRING PHYSICIAN: Dr. Donia Ast STIMULATION OR HORMONE WITHDRAW: Thyrogen Stimulation   DATE OF THYROIDECTOMY: 12-15-2018   SURGEON: Dr. Harlow Asa   TSH:   Lab Results  Component Value Date   TSH 10.200 (H) 12/05/2021   TSH 0.187 (L) 06/06/2021   TSH 0.043 (L) 12/06/2020     PRIOR I-131 THERAPY (Date and Dose): 05-06-2019   121 mCi   ADDITIONAL PHYSICIAN COMMENTS/NOTES   AUTHORIZED USER SIGNATURE & TIME STAMP:

## 2022-02-14 NOTE — Written Directive (Cosign Needed)
I-131 WHOLE BODY SCAN    RADIOPHARMACEUTICAL: Iodine-131 Capsule for Diagnostic Imaging   PRESCRIBED DOSE FOR ADMINISTRATION:    ROUTE OFADMINISTRATION: PO   DIAGNOSIS:    REFERRING PHYSICIAN:   THYROGEN STIMULATION OR HORMONE WITHDRAW:   DATE OF THYROIDECTOMY:   SURGEON:   TSH:   Lab Results  Component Value Date   TSH 10.200 (H) 12/05/2021   TSH 0.187 (L) 06/06/2021   TSH 0.043 (L) 12/06/2020     PRIOR I-131 THERAPY (Date and Dose):   ADDITIONAL PHYSICIAN COMMENTS/NOTES   AUTHORIZED USER SIGNATURE & TIME STAMP:

## 2022-02-22 ENCOUNTER — Other Ambulatory Visit: Payer: Self-pay | Admitting: "Endocrinology

## 2022-02-22 DIAGNOSIS — E890 Postprocedural hypothyroidism: Secondary | ICD-10-CM

## 2022-07-30 ENCOUNTER — Encounter (HOSPITAL_COMMUNITY): Admission: RE | Admit: 2022-07-30 | Payer: Medicaid Other | Source: Ambulatory Visit

## 2022-07-30 DIAGNOSIS — R897 Abnormal histological findings in specimens from other organs, systems and tissues: Secondary | ICD-10-CM | POA: Diagnosis present

## 2022-07-30 DIAGNOSIS — O2622 Pregnancy care for patient with recurrent pregnancy loss, second trimester: Secondary | ICD-10-CM | POA: Diagnosis present

## 2022-07-30 DIAGNOSIS — C73 Malignant neoplasm of thyroid gland: Secondary | ICD-10-CM

## 2022-07-30 DIAGNOSIS — D440 Neoplasm of uncertain behavior of thyroid gland: Secondary | ICD-10-CM

## 2022-07-30 DIAGNOSIS — E049 Nontoxic goiter, unspecified: Secondary | ICD-10-CM | POA: Diagnosis present

## 2022-07-30 DIAGNOSIS — E890 Postprocedural hypothyroidism: Secondary | ICD-10-CM | POA: Diagnosis present

## 2022-07-30 MED ORDER — STERILE WATER FOR INJECTION IJ SOLN
INTRAMUSCULAR | Status: AC
  Filled 2022-07-30: qty 10

## 2022-07-30 MED ORDER — THYROTROPIN ALFA 0.9 MG IM SOLR
INTRAMUSCULAR | Status: AC
  Filled 2022-07-30: qty 0.9

## 2022-07-30 MED ORDER — THYROTROPIN ALFA 0.9 MG IM SOLR
0.9000 mg | INTRAMUSCULAR | Status: AC
  Administered 2022-07-30: 0.9 mg via INTRAMUSCULAR

## 2022-07-31 ENCOUNTER — Encounter (HOSPITAL_COMMUNITY)
Admission: RE | Admit: 2022-07-31 | Discharge: 2022-07-31 | Disposition: A | Discharge status: Home / Self Care | Payer: Medicaid Other | Source: Ambulatory Visit | Attending: "Endocrinology | Admitting: "Endocrinology

## 2022-07-31 MED ORDER — STERILE WATER FOR INJECTION IJ SOLN
INTRAMUSCULAR | Status: AC
  Filled 2022-07-31: qty 10

## 2022-07-31 MED ORDER — THYROTROPIN ALFA 0.9 MG IM SOLR: 0.9000 mg | INTRAMUSCULAR | Status: DC

## 2022-07-31 MED ORDER — THYROTROPIN ALFA 0.9 MG IM SOLR
INTRAMUSCULAR | Status: AC
  Filled 2022-07-31: qty 0.9

## 2022-08-01 ENCOUNTER — Other Ambulatory Visit (HOSPITAL_COMMUNITY)
Admission: RE | Admit: 2022-08-01 | Discharge: 2022-08-01 | Disposition: A | Discharge status: Home / Self Care | Payer: Medicaid Other | Source: Ambulatory Visit | Attending: "Endocrinology | Admitting: "Endocrinology

## 2022-08-01 ENCOUNTER — Ambulatory Visit (HOSPITAL_COMMUNITY)
Admission: RE | Admit: 2022-08-01 | Discharge: 2022-08-01 | Disposition: A | Discharge status: Home / Self Care | Payer: Medicaid Other | Source: Ambulatory Visit | Attending: "Endocrinology | Admitting: "Endocrinology

## 2022-08-01 DIAGNOSIS — O2622 Pregnancy care for patient with recurrent pregnancy loss, second trimester: Secondary | ICD-10-CM | POA: Insufficient documentation

## 2022-08-01 DIAGNOSIS — D440 Neoplasm of uncertain behavior of thyroid gland: Secondary | ICD-10-CM | POA: Insufficient documentation

## 2022-08-01 DIAGNOSIS — C73 Malignant neoplasm of thyroid gland: Secondary | ICD-10-CM | POA: Diagnosis not present

## 2022-08-01 DIAGNOSIS — E049 Nontoxic goiter, unspecified: Secondary | ICD-10-CM | POA: Insufficient documentation

## 2022-08-01 DIAGNOSIS — R897 Abnormal histological findings in specimens from other organs, systems and tissues: Secondary | ICD-10-CM | POA: Insufficient documentation

## 2022-08-01 DIAGNOSIS — E890 Postprocedural hypothyroidism: Secondary | ICD-10-CM | POA: Insufficient documentation

## 2022-08-01 LAB — TSH: TSH: 73.261 u[IU]/mL — ABNORMAL HIGH (ref 0.350–4.500)

## 2022-08-01 LAB — T4, FREE: Free T4: 0.79 ng/dL (ref 0.61–1.12)

## 2022-08-01 MED ORDER — SODIUM IODIDE I 131 CAPSULE
3.8200 | Freq: Once | INTRAVENOUS | Status: AC | PRN
  Administered 2022-08-01: 3.82 via ORAL

## 2022-08-03 LAB — THYROGLOBULIN ANTIBODY: Thyroglobulin Antibody: <1 IU/mL (ref 0.0–0.9)

## 2022-08-04 ENCOUNTER — Encounter (HOSPITAL_COMMUNITY)
Admission: RE | Admit: 2022-08-04 | Discharge: 2022-08-04 | Disposition: A | Discharge status: Home / Self Care | Payer: Medicaid Other | Source: Ambulatory Visit | Attending: "Endocrinology | Admitting: "Endocrinology

## 2022-08-11 LAB — THYROGLOBULIN LEVEL: Thyroglobulin: <2 ng/mL

## 2022-08-20 ENCOUNTER — Encounter: Payer: Self-pay | Admitting: "Endocrinology

## 2022-08-20 ENCOUNTER — Ambulatory Visit (INDEPENDENT_AMBULATORY_CARE_PROVIDER_SITE_OTHER): Payer: Medicaid Other | Admitting: "Endocrinology

## 2022-08-20 VITALS — BP 122/78 | HR 88 | Ht 62.0 in | Wt 183.4 lb

## 2022-08-20 DIAGNOSIS — C73 Malignant neoplasm of thyroid gland: Secondary | ICD-10-CM

## 2022-08-20 DIAGNOSIS — E890 Postprocedural hypothyroidism: Secondary | ICD-10-CM | POA: Diagnosis not present

## 2022-08-20 MED ORDER — LEVOTHYROXINE SODIUM 137 MCG PO TABS: 137.0000 ug | ORAL_TABLET | Freq: Every day | ORAL | 1 refills | Status: DC

## 2022-08-20 NOTE — Progress Notes (Signed)
08/20/2022, 1:15 PM      Endocrinology follow-up note  Subjective:    Patient ID: Amy Nelson, female    DOB: 1985/12/02, PCP Sharlene Dory, NP   Past Medical History:  Diagnosis Date   Asthma    Cancer (HCC)    Thyroid   Diabetes mellitus without complication (HCC)    Gestational    Past Surgical History:  Procedure Laterality Date   BREAST REDUCTION SURGERY     CESAREAN SECTION     THYROIDECTOMY N/A 12/15/2018   Procedure: TOTAL THYROIDECTOMY;  Surgeon: Darnell Level, MD;  Location: [facility] OR;  Service: General;  Laterality: N/A;   Social History   Socioeconomic History   Marital status: Single    Spouse name: Not on file   Number of children: Not on file   Years of education: Not on file   Highest education level: Not on file  Occupational History   Not on file  Tobacco Use   Smoking status: Never   Smokeless tobacco: Never  Vaping Use   Vaping status: Never Used  Substance and Sexual Activity   Alcohol use: No   Drug use: No   Sexual activity: Yes    Birth control/protection: None  Other Topics Concern   Not on file  Social History Narrative   Not on file   Social Determinants of Health   Financial Resource Strain: Not on file  Food Insecurity: Not on file  Transportation Needs: Not on file  Physical Activity: Not on file  Stress: Not on file  Social Connections: Not on file   Family History  Problem Relation Age of Onset   Diabetes Mother    Hypertension Mother    Hypertension Father    Hyperlipidemia Father    Outpatient Encounter Medications as of 08/20/2022  Medication Sig   levothyroxine (SYNTHROID) 137 MCG tablet Take 1 tablet (137 mcg total) by mouth daily before breakfast.   topiramate (TOPAMAX) 25 MG tablet Take 2 tablets by mouth 2 (two) times daily.   [DISCONTINUED] levothyroxine (SYNTHROID) 125 MCG tablet Take 1 tablet (125 mcg total) by mouth daily before breakfast.   No  facility-administered encounter medications on file as of 08/20/2022.   ALLERGIES: No Known Allergies  VACCINATION STATUS:  There is no immunization history on file for this patient.  HPI Amy Nelson is 37 y.o. 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  -She underwent thyroidectomy November 2020. -She underwent Thyrogen stimulated I-131 thyroid remnant ablation subsequently.   Post therapy whole-body scan was negative for distant metastases, possible thyroid remnant in the neck. Her recent thyroid/neck ultrasound on March 26, 2020 showed surgical changes consistent with total thyroidectomy without evidence of residual or recurrent disease.   She is currently on levothyroxine 125 mcg p.o. daily before breakfast.  She reports compliance.  Her previsit labs are consistent with slight under replacement.     She denies palpitations, tremors, heat/cold intolerance.  She missed her appointment since May 2023.  She was without her thyroid hormone for some time in the interval. Her previsit labs are consistent with under replacement.    Her prior history is  as follows: She underwent fine-needle aspiration of an nodular goiter on November 04, 2018.  Cytology report confirmed atypia of undetermined significance.  Subsequent molecular studies confirm approximately 50% risk of malignancy.   She was sent for thyroidectomy during her second trimester pregnancy. She underwent total thyroidectomy on December 15, 2018.  Her surgical sample showed follicular neoplasm.  Outside consultation to Dr. Tonny Bollman of [facility] reviewed the slides and summary was 7.7 cm left lobe mass: Grossly encapsulated, angioinvasive, follicular carcinoma with at least 2 foci of vascular invasion in tumor capsule; tumor confined to the thyroid.    -Thyrogen stimulated I-131 thyroid ablation and   Post therapy whole-body scan  performed from April 16-April 26, see below.  She has recovered from her surgery very well, except for surgical site keloid.    She denies any family history of thyroid malignancy, however her mother has hypothyroidism on thyroid hormone supplement. She denies exposure to neck radiation.   Review of Systems Limited as above.  Objective:    BP 122/78   Pulse 88   Ht 5\' 2"  (1.575 m)   Wt 183 lb 6.4 oz (83.2 kg)   LMP 06/13/2012   BMI 33.54 kg/m   Wt Readings from Last 3 Encounters:  08/20/22 183 lb 6.4 oz (83.2 kg)  02/12/22 186 lb 3.2 oz (84.5 kg)  06/10/21 187 lb (84.8 kg)    Physical Exam   Constitutional:  Body mass index is 33.54 kg/m. , not in acute distress, normal state of mind Eyes:  EOMI, no exophthalmos Neck: Supple Thyroid: + Post thyroidectomy scar with some moderate keloid formation.      CMP ( most recent) CMP     Component Value Date/Time   NA 133 (L) 12/16/2018 0534   K 3.8 12/16/2018 0534   CL 101 12/16/2018 0534   CO2 22 12/16/2018 0534   GLUCOSE 128 (H) 12/16/2018 0534   BUN <5 (L) 12/16/2018 0534   CREATININE 0.39 (L) 12/16/2018 0534   CALCIUM 8.7 (L) 12/16/2018 0534   GFRNONAA >60 12/16/2018 0534   GFRAA >60 12/16/2018 0534     Summary of her thyroid ultrasound from September 10, 2018: Right lobe 5.1 cm x 1.2 cm x 1.3 cm-no nodules. Left lobe 8.5 mm x 5.3 cm x 4.4 cm with 7.7 cm nodule observed to increase in size compared to prior study from February 2018.  Thyroid fine-needle aspiration cytology: Atypia of undetermined significance.  The smears are cellular with sheets of follicular cells, follicles and colloid.  There are scattered microfollicular aggregates present.  No papillary architecture or Hurthle cell change.  Afirma 50% risk of Malignancy   Total thyroidectomy on December 15, 2018 FINAL MICROSCOPIC DIAGNOSIS: A. THYROID, TOTAL THYROIDECTOMY: - Follicular neoplasm, pending outside consultation - See  comment COMMENT: Slides were sent for outside consultation. Outside consultation to Dr. Tonny Bollman of [facility] reviewed the slides and summary was 7.7 cm left lobe mass: Grossly encapsulated, angioinvasive, follicular carcinoma with at least 2 foci of vascular invasion in tumor capsule; tumor confined to the thyroid.    Recent Results (from the past 2160 hour(s))  Thyroglobulin antibody     Status: None   Collection Time: 08/01/22  9:05 AM  Result Value Ref Range   Thyroglobulin Antibody <1.0 0.0 - 0.9 IU/mL    Comment: (NOTE) Thyroglobulin Antibody measured by [employer] Methodology It should be noted that the presence of thyroglobulin antibodies may not be pathogenic nor diagnostic, especially at  very low levels. The assay manufacturer has found that four percent of individuals without evidence of thyroid disease or autoimmunity will have positive TgAb levels up to 4 IU/mL. Performed At: [facility] [address] [place], [place] [id number] Jolene Schimke MD SA:[id number]   Thyroglobulin     Status: None   Collection Time: 08/01/22  9:05 AM  Result Value Ref Range   Thyroglobulin <2.0 ng/mL    Comment: (NOTE) This test was developed and its performance characteristics determined by [facility]. It has not been cleared or approved by the Food and Drug Administration. Reference Range: Pubertal Children and Adults: <40 According to the [facility] of Clinical Biochemistry, the reference interval for Thyroglobulin (TG) should be related to euthyroid patients and not for patients who underwent thyroidectomy.  TG reference intervals for these patients depend on the residual mass of the thyroid tissue left after surgery.  Establishing a post-operative baseline is recommended.  The assay quantitation limit is 2.0 ng/mL. Performed At: [facility] [address] [place], [place] [id number] Ethel Rana MD UR:[id number]   T4, free      Status: None   Collection Time: 08/01/22  9:06 AM  Result Value Ref Range   Free T4 0.79 0.61 - 1.12 ng/dL    Comment: (NOTE) Biotin ingestion may interfere with free T4 tests. If the results are inconsistent with the TSH level, previous test results, or the clinical presentation, then consider biotin interference. If needed, order repeat testing after stopping biotin. Performed at [facility], [address], [place], [place] [address]   TSH     Status: Abnormal   Collection Time: 08/01/22  9:06 AM  Result Value Ref Range   TSH 73.261 (H) 0.350 - 4.500 uIU/mL    Comment: Performed by a 3rd Generation assay with a functional sensitivity of <=0.01 uIU/mL. Performed at [facility], [address], [place], [place] [address]    Thyroid insulated I-131 thyroid remnant ablation between April 16-3/26/2021. FINDINGS: Uptake at the thyroid bed bilaterally consistent with remnant.  No additional sites of abnormal radio iodine accumulation are seen to suggest iodine-avid metastatic thyroid cancer.   Expected tracer excretion into urinary bladder.    Thyroid/neck ultrasound March 26, 2020 IMPRESSION: Thyroid remnant bilaterally.  No scintigraphic evidence of iodine-avid metastatic thyroid cancer.  FINDINGS: The thyroid gland is surgically absent. Evaluation of the left and right thyroid resection beds demonstrates no evidence of residual or recurrent thyroid tissue. No nodularity or suspicious lymphadenopathy.   IMPRESSION: Surgical changes of total thyroidectomy without evidence of residual or recurrent disease.  Thyrogen stimulated whole-body scan on August 04, 2022 FINDINGS: No radiotracer activity in thyroid bed. No radiotracer activity on whole-body scan.   IMPRESSION: No evidence of local recurrence of thyroid carcinoma thyroid bed.  No evidence of metastatic disease on whole-body scan.  Assessment & Plan:    1.  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  Nodular  goiter -resolved 3.   Postsurgical hypothyroidism  -She is status post total thyroidectomy, follicular carcinoma confirmed on left lobe.  This was followed by Thyrogen stimulated radioactive iodine ablation of thyroid remnants.  She has a series of post- therapy surveillance imaging studies which are negative for tumor recurrence.  This completes the initial intensive phase of thyroid cancer.  She is made aware of the need for continued surveillance studies for the next 5 to 8 years .  Her next imaging will be after a year.  For her surgical hypothyroidism: Her pre-visit thyroid function tests are consistent with under replacement.  I discussed and increase her levothyroxine to 137 mcg p.o. daily before breakfast.    - We discussed about the correct intake of her thyroid hormone, on empty stomach at fasting, with water, separated by at least 30 minutes from breakfast and other medications,  and separated by more than 4 hours from calcium, iron, multivitamins, acid reflux medications (PPIs). -Patient is made aware of the fact that thyroid hormone replacement is needed for life, dose to be adjusted by periodic monitoring of thyroid function tests.   - I advised her  to maintain close follow up with Sharlene Dory, NP for primary care needs, as well as her OB/GYN given her pregnancy.   I spent  25  minutes in the care of the patient today including review of labs from Thyroid Function, CMP, and other relevant labs ; imaging/biopsy records (current and previous including abstractions from other facilities); face-to-face time discussing  her lab results and symptoms, medications doses, her options of short and long term treatment based on the latest standards of care / guidelines;   and documenting the encounter.  Regnia D Hohmann  participated in the discussions, expressed understanding, and voiced agreement with the above plans.  All questions were answered to her satisfaction. she is encouraged to  contact clinic should she have any questions or concerns prior to her return visit.   Follow up plan: Return in about 6 months (around 02/20/2023) for Fasting Labs  in AM B4 8.   Marquis Lunch, MD [facility] [address] [place], [place] [address] Phone: [phone]  Fax: [phone]     08/20/2022, 1:15 PM  This note was partially dictated with voice recognition software. Similar sounding words can be transcribed inadequately or may not  be corrected upon review.

## 2022-09-27 IMAGING — US US THYROID
1 series · 14 of 20 positions shown · non-contrast
Comparison: None.

CLINICAL DATA: Other. 34-year-old female with a history of total
thyroidectomy for thyroid cancer followed by radioactive iodine
ablation.

EXAM:
THYROID ULTRASOUND
TECHNIQUE: Ultrasound examination of the thyroid gland and adjacent soft
tissues was performed.

[Series 1: us thyroid · 14 of 20 slices shown]
[im 1/20]
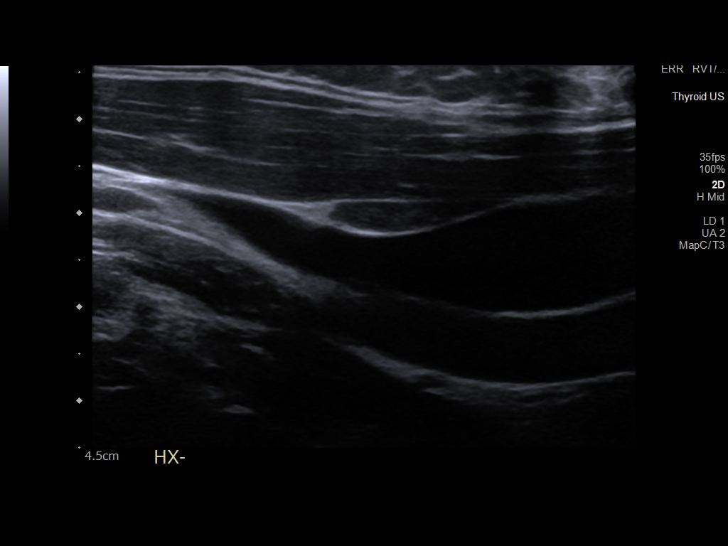
[im 3/20]
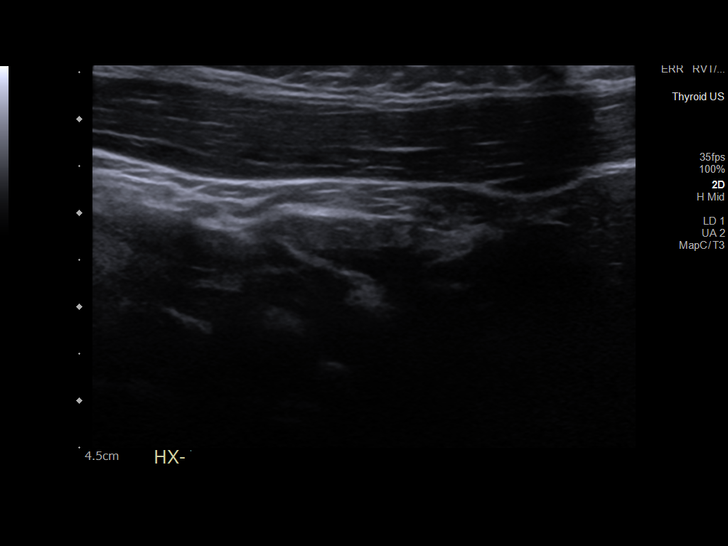
[im 4/20]
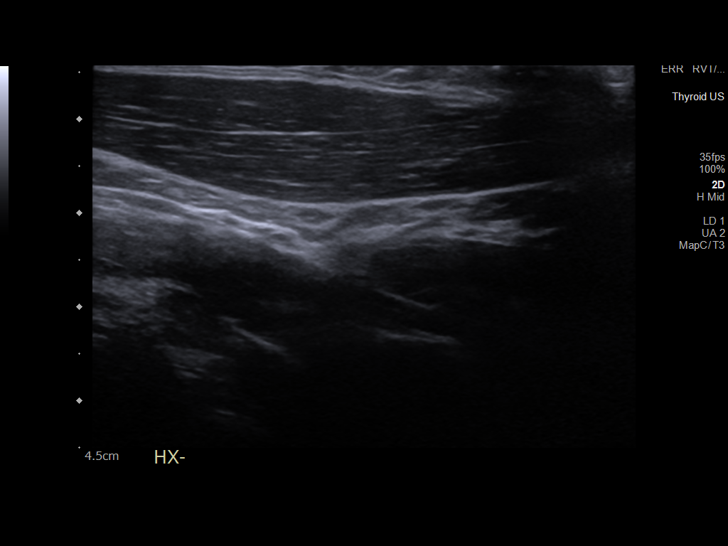
[im 6/20]
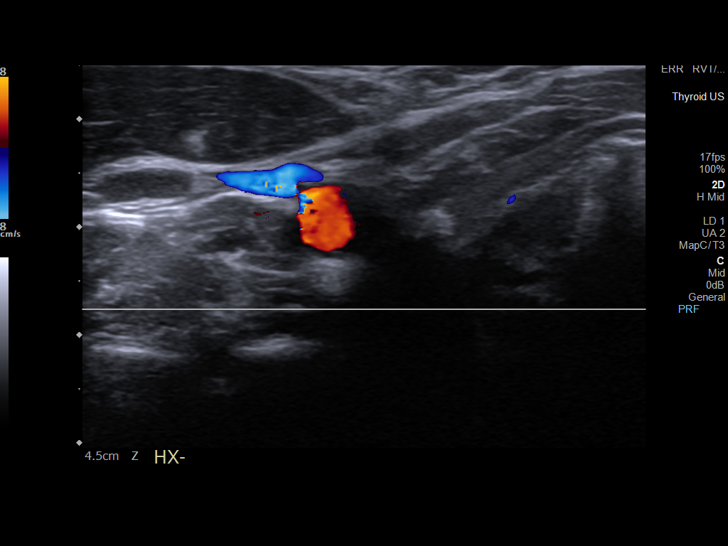
[im 7/20]
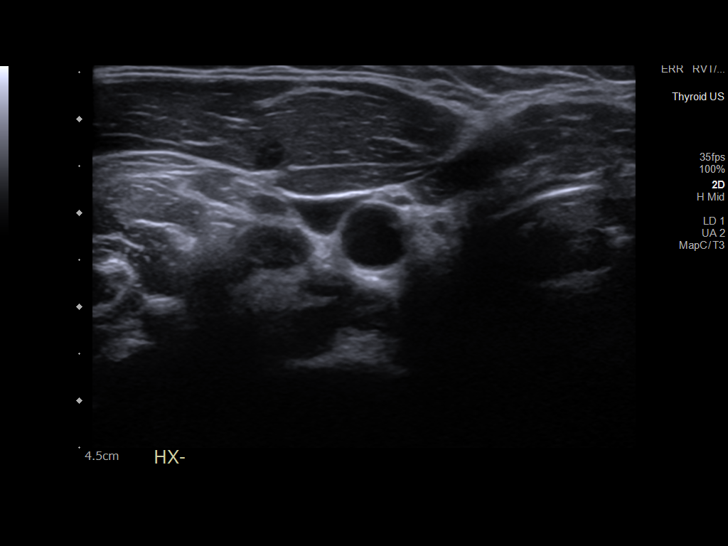
[im 8/20]
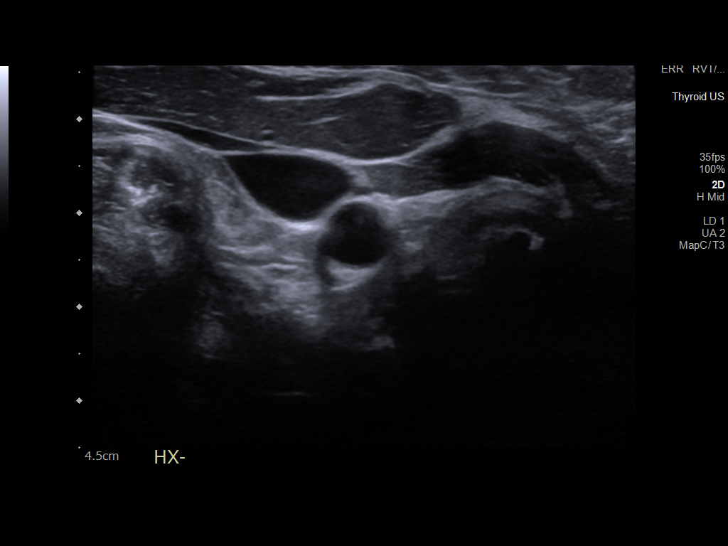
[im 10/20]
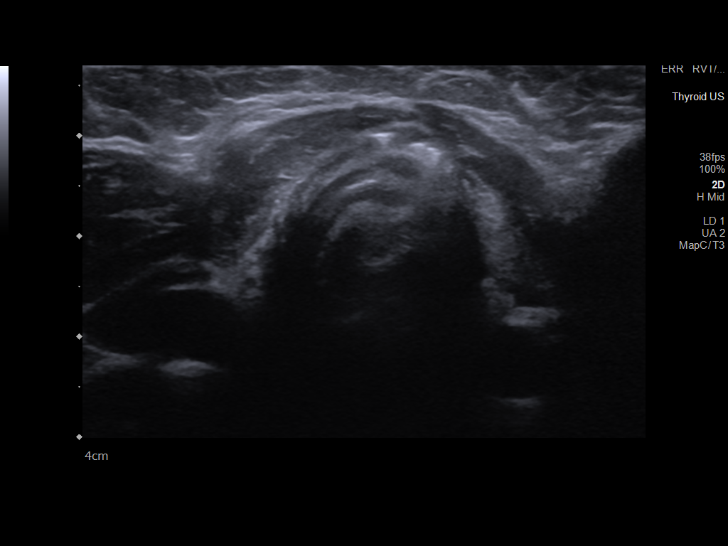
[im 11/20]
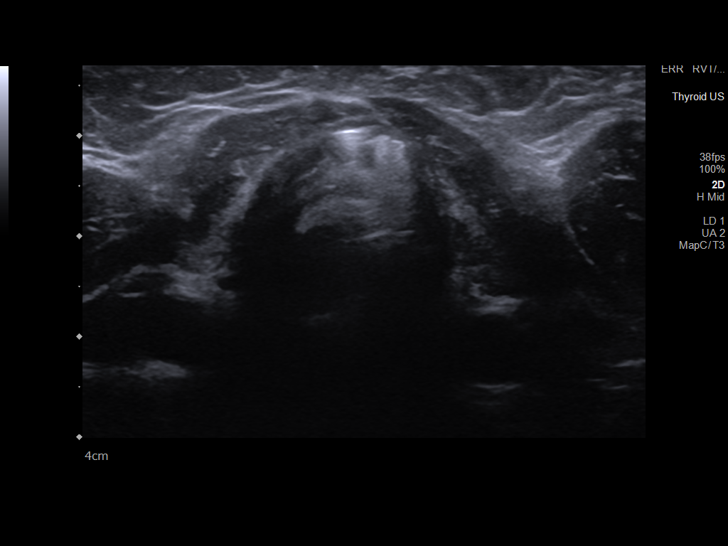
[im 13/20]
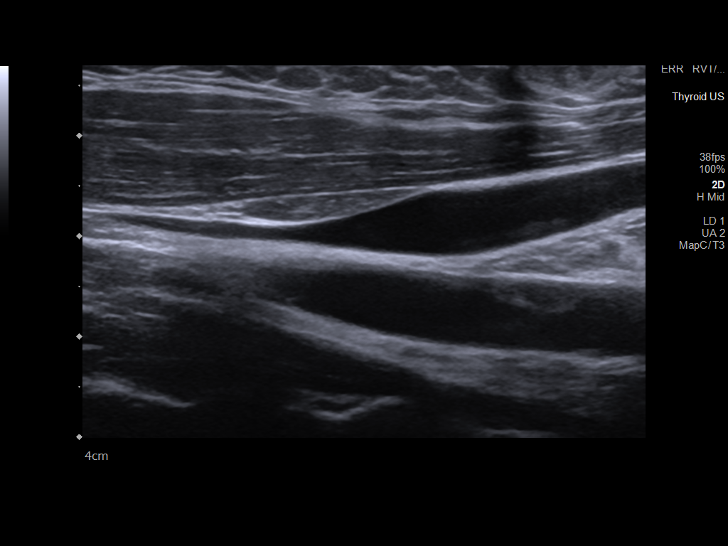
[im 14/20]
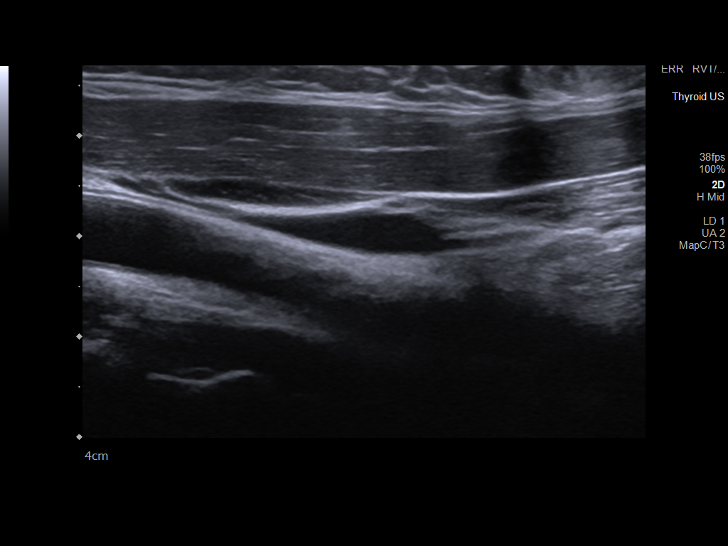
[im 16/20]
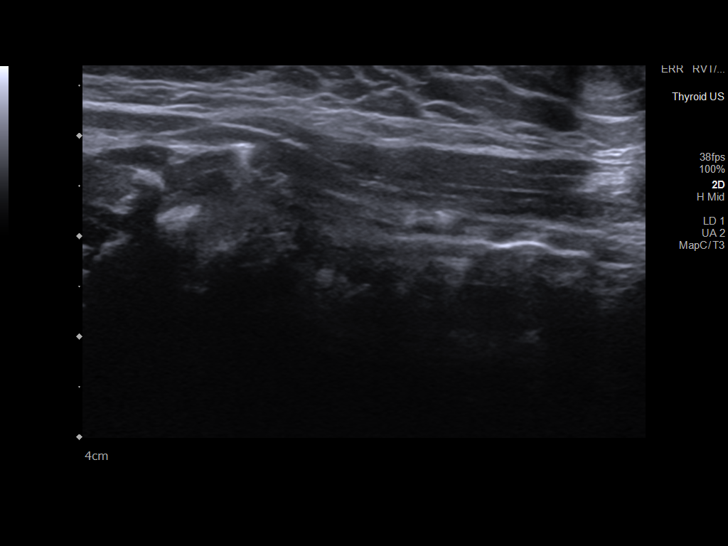
[im 17/20]
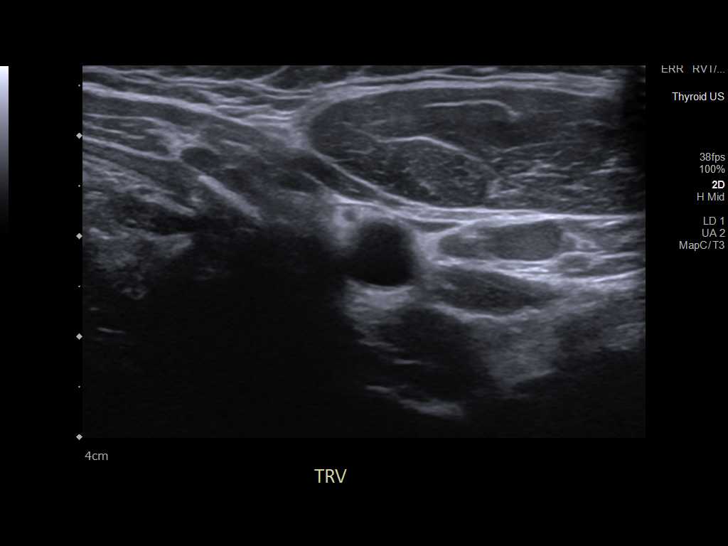
[im 18/20]
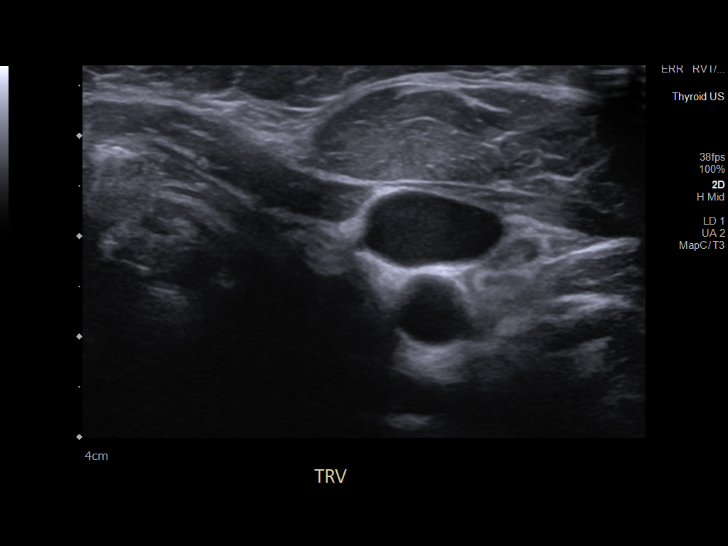
[im 20/20]
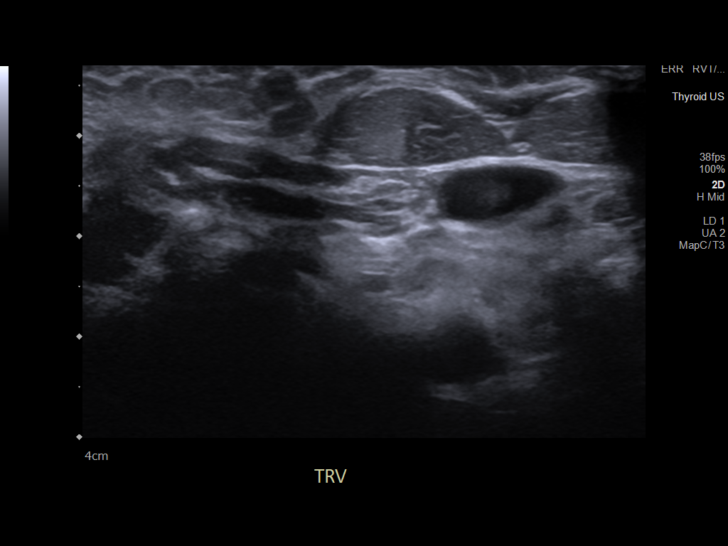

[14 of 20 positions shown; findings below may reference images not displayed]

FINDINGS: The thyroid gland is surgically absent. Evaluation of the left and
right thyroid resection beds demonstrates no evidence of residual or
recurrent thyroid tissue. No nodularity or suspicious
lymphadenopathy.
IMPRESSION: Surgical changes of total thyroidectomy without evidence of residual
or recurrent disease.

## 2022-10-06 ENCOUNTER — Other Ambulatory Visit: Payer: Self-pay | Admitting: "Endocrinology

## 2022-10-06 DIAGNOSIS — E890 Postprocedural hypothyroidism: Secondary | ICD-10-CM

## 2023-02-20 ENCOUNTER — Other Ambulatory Visit: Payer: Self-pay | Admitting: "Endocrinology

## 2023-02-20 DIAGNOSIS — E890 Postprocedural hypothyroidism: Secondary | ICD-10-CM

## 2023-02-20 DIAGNOSIS — C73 Malignant neoplasm of thyroid gland: Secondary | ICD-10-CM

## 2023-02-20 DIAGNOSIS — E785 Hyperlipidemia, unspecified: Secondary | ICD-10-CM

## 2023-02-25 ENCOUNTER — Ambulatory Visit: Payer: Medicaid Other | Admitting: "Endocrinology

## 2023-02-25 ENCOUNTER — Encounter: Payer: Self-pay | Admitting: "Endocrinology

## 2023-02-25 VITALS — BP 118/84 | HR 76 | Ht 62.0 in | Wt 188.6 lb

## 2023-02-25 DIAGNOSIS — C73 Malignant neoplasm of thyroid gland: Secondary | ICD-10-CM | POA: Diagnosis not present

## 2023-02-25 DIAGNOSIS — E6609 Other obesity due to excess calories: Secondary | ICD-10-CM | POA: Insufficient documentation

## 2023-02-25 DIAGNOSIS — E890 Postprocedural hypothyroidism: Secondary | ICD-10-CM

## 2023-02-25 DIAGNOSIS — E66811 Obesity, class 1: Secondary | ICD-10-CM | POA: Insufficient documentation

## 2023-02-25 DIAGNOSIS — E785 Hyperlipidemia, unspecified: Secondary | ICD-10-CM

## 2023-02-25 DIAGNOSIS — Z6834 Body mass index (BMI) 34.0-34.9, adult: Secondary | ICD-10-CM

## 2023-02-25 MED ORDER — LEVOTHYROXINE SODIUM 150 MCG PO TABS
150.0000 ug | ORAL_TABLET | Freq: Every day | ORAL | 1 refills | Status: DC
Start: 2023-02-25 — End: 2023-09-08

## 2023-02-25 NOTE — Progress Notes (Signed)
 02/25/2023, 12:42 PM      Endocrinology follow-up note  Subjective:    Patient ID: Amy Nelson, female    DOB: [birth year], PCP Joelin, Vineetha, NP   Past Medical History:  Diagnosis Date   Asthma    Cancer (HCC)    Thyroid    Diabetes mellitus without complication (HCC)    Gestational    Past Surgical History:  Procedure Laterality Date   BREAST REDUCTION SURGERY     CESAREAN SECTION     THYROIDECTOMY N/A 12/15/2018   Procedure: TOTAL THYROIDECTOMY;  Surgeon: Eletha Boas, MD;  Location: [facility] OR;  Service: General;  Laterality: N/A;   Social History   Socioeconomic History   Marital status: Single    Spouse name: Not on file   Number of children: Not on file   Years of education: Not on file   Highest education level: Not on file  Occupational History   Not on file  Tobacco Use   Smoking status: Never   Smokeless tobacco: Never  Vaping Use   Vaping status: Never Used  Substance and Sexual Activity   Alcohol use: No   Drug use: No   Sexual activity: Yes    Birth control/protection: None  Other Topics Concern   Not on file  Social History Narrative   Not on file   Social Drivers of Health   Financial Resource Strain: Not on file  Food Insecurity: Not on file  Transportation Needs: Not on file  Physical Activity: Not on file  Stress: Not on file  Social Connections: Not on file   Family History  Problem Relation Age of Onset   Diabetes Mother    Hypertension Mother    Hypertension Father    Hyperlipidemia Father    Outpatient Encounter Medications as of 02/25/2023  Medication Sig   levothyroxine  (SYNTHROID ) 150 MCG tablet Take 1 tablet (150 mcg total) by mouth daily before breakfast.   topiramate (TOPAMAX) 25 MG tablet Take 2 tablets by mouth 2 (two) times daily.   [DISCONTINUED] levothyroxine  (SYNTHROID ) 137 MCG tablet Take 1 tablet (137 mcg total) by mouth daily before breakfast.    [DISCONTINUED] levothyroxine  (SYNTHROID ) 137 MCG tablet Take 1 tablet (137 mcg total) by mouth daily before breakfast.   No facility-administered encounter medications on file as of 02/25/2023.   ALLERGIES: No Known Allergies  VACCINATION STATUS:  There is no immunization history on file for this patient.  HPI Amy Nelson is 38 y.o. 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  -She underwent thyroidectomy November 2020. -She underwent Thyrogen  stimulated I-131 thyroid  remnant ablation subsequently.   Post therapy whole-body scan was negative for distant metastases, possible thyroid  remnant in the neck. Her recent thyroid /neck ultrasound on March 26, 2020 showed surgical changes consistent with total thyroidectomy without evidence of residual or recurrent disease.   She is currently on levothyroxine  137 mcg p.o. daily before breakfast.  She reports compliance.  Her previsit labs are consistent with slight under replacement.     She denies palpitations, tremors, heat/cold intolerance.  She missed her appointment since May 2023.  She was without her thyroid  hormone for some  time in the interval. Her previsit labs are consistent with under replacement.    Her prior history is as follows: She underwent fine-needle aspiration of an nodular goiter on November 04, 2018.  Cytology report confirmed atypia of undetermined significance.  Subsequent molecular studies confirm approximately 50% risk of malignancy.   She was sent for thyroidectomy during her second trimester pregnancy. She underwent total thyroidectomy on December 15, 2018.  Her surgical sample showed follicular neoplasm.  Outside consultation to Dr. Author April of [facility] reviewed the slides and summary was 7.7 cm left lobe mass: Grossly encapsulated, angioinvasive, follicular carcinoma with at least 2 foci of vascular invasion in tumor  capsule; tumor confined to the thyroid .    -Thyrogen  stimulated I-131 thyroid  ablation and   Post therapy whole-body scan performed from April 16-April 26, see below.  She has recovered from her surgery very well, except for surgical site keloid.    She denies any family history of thyroid  malignancy, however her mother has hypothyroidism on thyroid  hormone supplement. She denies exposure to neck radiation.   Review of Systems Limited as above.  Objective:    BP 118/84   Pulse 76   Ht 5' 2 (1.575 m)   Wt 188 lb 9.6 oz (85.5 kg)   LMP 06/13/2012   BMI 34.50 kg/m   Wt Readings from Last 3 Encounters:  02/25/23 188 lb 9.6 oz (85.5 kg)  08/20/22 183 lb 6.4 oz (83.2 kg)  02/12/22 186 lb 3.2 oz (84.5 kg)    Physical Exam   Constitutional:  Body mass index is 34.5 kg/m. , not in acute distress, normal state of mind Eyes:  EOMI, no exophthalmos Neck: Supple Thyroid : + Post thyroidectomy scar with some moderate keloid formation.      CMP ( most recent) CMP     Component Value Date/Time   NA 133 (L) 12/16/2018 0534   K 3.8 12/16/2018 0534   CL 101 12/16/2018 0534   CO2 22 12/16/2018 0534   GLUCOSE 128 (H) 12/16/2018 0534   BUN <5 (L) 12/16/2018 0534   CREATININE 0.39 (L) 12/16/2018 0534   CALCIUM  8.7 (L) 12/16/2018 0534   GFRNONAA >60 12/16/2018 0534   GFRAA >60 12/16/2018 0534     Summary of her thyroid  ultrasound from September 10, 2018: Right lobe 5.1 cm x 1.2 cm x 1.3 cm-no nodules. Left lobe 8.5 mm x 5.3 cm x 4.4 cm with 7.7 cm nodule observed to increase in size compared to prior study from February 2018.  Thyroid  fine-needle aspiration cytology: Atypia of undetermined significance.  The smears are cellular with sheets of follicular cells, follicles and colloid.  There are scattered microfollicular aggregates present.  No papillary architecture or Hurthle cell change.  Afirma 50% risk of Malignancy   Total thyroidectomy on December 15, 2018 FINAL MICROSCOPIC  DIAGNOSIS: A. THYROID , TOTAL THYROIDECTOMY: - Follicular neoplasm, pending outside consultation - See comment COMMENT: Slides were sent for outside consultation. Outside consultation to Dr. Alverda April of [facility] reviewed the slides and summary was 7.7 cm left lobe mass: Grossly encapsulated, angioinvasive, follicular carcinoma with at least 2 foci of vascular invasion in tumor capsule; tumor confined to the thyroid .    Recent Results (from the past 2160 hours)  Thyroglobulin Level     Status: None (Preliminary result)   Collection Time: 02/23/23  8:24 AM  Result Value Ref Range   Thyroglobulin (TG-RIA) WILL FOLLOW   Thyroglobulin antibody     Status: None  Collection Time: 02/23/23  8:24 AM  Result Value Ref Range   Thyroglobulin Antibody <1.0 0.0 - 0.9 IU/mL    Comment: Thyroglobulin Antibody measured by [employer] Methodology It should be noted that the presence of thyroglobulin antibodies may not be pathogenic nor diagnostic, especially at very low levels. The assay manufacturer has found that four percent of individuals without evidence of thyroid  disease or autoimmunity will have positive TgAb levels up to 4 IU/mL.   Lipid Panel     Status: Abnormal   Collection Time: 02/23/23  8:24 AM  Result Value Ref Range   Cholesterol, Total 204 (H) 100 - 199 mg/dL   Triglycerides 893 0 - 149 mg/dL   HDL 53 >60 mg/dL   VLDL Cholesterol Cal 19 5 - 40 mg/dL   LDL Chol Calc (NIH) 867 (H) 0 - 99 mg/dL   Chol/HDL Ratio 3.8 0.0 - 4.4 ratio    Comment:                                   T. Chol/HDL Ratio                                             Men  Women                               1/2 Avg.Risk  3.4    3.3                                   Avg.Risk  5.0    4.4                                2X Avg.Risk  9.6    7.1                                3X Avg.Risk 23.4   11.0   T4, Free     Status: None   Collection Time: 02/23/23  8:24 AM  Result Value Ref Range   Free  T4 1.41 0.82 - 1.77 ng/dL  TSH     Status: Abnormal   Collection Time: 02/23/23  8:24 AM  Result Value Ref Range   TSH 16.800 (H) 0.450 - 4.500 uIU/mL   Thyroid  insulated I-131 thyroid  remnant ablation between April 16-3/26/2021. FINDINGS: Uptake at the thyroid  bed bilaterally consistent with remnant.  No additional sites of abnormal radio iodine accumulation are seen to suggest iodine-avid metastatic thyroid  cancer.   Expected tracer excretion into urinary bladder.    Thyroid /neck ultrasound March 26, 2020 IMPRESSION: Thyroid  remnant bilaterally.  No scintigraphic evidence of iodine-avid metastatic thyroid  cancer.  FINDINGS: The thyroid  gland is surgically absent. Evaluation of the left and right thyroid  resection beds demonstrates no evidence of residual or recurrent thyroid  tissue. No nodularity or suspicious lymphadenopathy.   IMPRESSION: Surgical changes of total thyroidectomy without evidence of residual or recurrent disease.  Thyrogen  stimulated whole-body scan on August 04, 2022 FINDINGS: No radiotracer activity in thyroid  bed. No radiotracer activity on whole-body scan.   IMPRESSION:  No evidence of local recurrence of thyroid  carcinoma thyroid  bed.  No evidence of metastatic disease on whole-body scan.  Assessment & Plan:    1.  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  Nodular goiter -resolved 3.   Postsurgical hypothyroidism 3.  Hyperlipidemia  -She is status post total thyroidectomy on December 15, 2018, follicular carcinoma confirmed on left lobe.  This was followed by Thyrogen  stimulated radioactive iodine ablation of thyroid  remnants.  She has a series of post- therapy surveillance imaging studies which are negative for tumor recurrence.  This completes the initial intensive phase of thyroid  cancer.  She is made aware of the need for continued surveillance studies for the next 5 to 8 years .  She will be considered for surveillance thyroid /neck ultrasound  before her next visit in 6 months.      For her surgical hypothyroidism: Her pre-visit thyroid  function tests are consistent with under replacement.  I discussed and increased her levothyroxine  to 150 mcg p.o. daily before breakfast.     - We discussed about the correct intake of her thyroid  hormone, on empty stomach at fasting, with water , separated by at least 30 minutes from breakfast and other medications,  and separated by more than 4 hours from calcium , iron , multivitamins, acid reflux medications (PPIs). -Patient is made aware of the fact that thyroid  hormone replacement is needed for life, dose to be adjusted by periodic monitoring of thyroid  function tests.   hyperlipidemia: Whole food plant-based diet was discussed and recommended to her.  If her LDL remains above 100 mg/day, she should be considered for low-dose statin next visit.  - I advised her  to maintain close follow up with Joelin, Vineetha, NP for primary care needs, as well as her OB/GYN given her pregnancy.   I spent  25  minutes in the care of the patient today including review of labs from Thyroid  Function, CMP, and other relevant labs ; imaging/biopsy records (current and previous including abstractions from other facilities); face-to-face time discussing  her lab results and symptoms, medications doses, her options of short and long term treatment based on the latest standards of care / guidelines;   and documenting the encounter.  Belita D Hessling  participated in the discussions, expressed understanding, and voiced agreement with the above plans.  All questions were answered to her satisfaction. she is encouraged to contact clinic should she have any questions or concerns prior to her return visit.    Follow up plan: Return in about 6 months (around 08/25/2023) for Fasting Labs  in AM B4 8, Thyroid  / Neck Ultrasound.   Ranny Earl, MD [facility] [address] [place], [place] [address] Phone: [phone]  Fax: [phone]     02/25/2023, 12:42 PM  This note was partially dictated with voice recognition software. Similar sounding words can be transcribed inadequately or may not  be corrected upon review.

## 2023-03-02 LAB — THYROGLOBULIN ANTIBODY: Thyroglobulin Antibody: <1 [IU]/mL (ref 0.0–0.9)

## 2023-03-02 LAB — LIPID PANEL
Chol/HDL Ratio: 3.8 {ratio} (ref 0.0–4.4)
Cholesterol, Total: 204 mg/dL — ABNORMAL HIGH (ref 100–199)
HDL: 53 mg/dL (ref >39)
LDL Chol Calc (NIH): 132 mg/dL — ABNORMAL HIGH (ref 0–99)
Triglycerides: 106 mg/dL (ref 0–149)
VLDL Cholesterol Cal: 19 mg/dL (ref 5–40)

## 2023-03-02 LAB — TSH: TSH: 16.8 u[IU]/mL — ABNORMAL HIGH (ref 0.450–4.500)

## 2023-03-02 LAB — T4, FREE: Free T4: 1.41 ng/dL (ref 0.82–1.77)

## 2023-03-02 LAB — THYROGLOBULIN LEVEL: Thyroglobulin (TG-RIA): <2 ng/mL

## 2023-03-04 ENCOUNTER — Ambulatory Visit (HOSPITAL_COMMUNITY)
Admission: RE | Admit: 2023-03-04 | Discharge: 2023-03-04 | Disposition: A | Discharge status: Home / Self Care | Payer: Medicaid Other | Source: Ambulatory Visit | Attending: "Endocrinology | Admitting: "Endocrinology

## 2023-03-04 DIAGNOSIS — C73 Malignant neoplasm of thyroid gland: Secondary | ICD-10-CM | POA: Insufficient documentation

## 2023-07-28 ENCOUNTER — Emergency Department (HOSPITAL_COMMUNITY)

## 2023-07-28 ENCOUNTER — Other Ambulatory Visit: Payer: Self-pay

## 2023-07-28 ENCOUNTER — Emergency Department (HOSPITAL_COMMUNITY)
Admission: EM | Admit: 2023-07-28 | Discharge: 2023-07-28 | Disposition: A | Discharge status: Home / Self Care | Source: Ambulatory Visit | Attending: Emergency Medicine | Admitting: Emergency Medicine

## 2023-07-28 ENCOUNTER — Encounter (HOSPITAL_COMMUNITY): Payer: Self-pay | Admitting: Emergency Medicine

## 2023-07-28 DIAGNOSIS — R0789 Other chest pain: Secondary | ICD-10-CM | POA: Insufficient documentation

## 2023-07-28 DIAGNOSIS — R739 Hyperglycemia, unspecified: Secondary | ICD-10-CM | POA: Insufficient documentation

## 2023-07-28 DIAGNOSIS — R079 Chest pain, unspecified: Secondary | ICD-10-CM

## 2023-07-28 DIAGNOSIS — J45909 Unspecified asthma, uncomplicated: Secondary | ICD-10-CM | POA: Insufficient documentation

## 2023-07-28 DIAGNOSIS — R519 Headache, unspecified: Secondary | ICD-10-CM | POA: Diagnosis not present

## 2023-07-28 LAB — BASIC METABOLIC PANEL WITH GFR
Anion gap: 10 (ref 5–15)
BUN: 12 mg/dL (ref 6–20)
CO2: 22 mmol/L (ref 22–32)
Calcium: 8.8 mg/dL — ABNORMAL LOW (ref 8.9–10.3)
Chloride: 103 mmol/L (ref 98–111)
Creatinine, Ser: 0.63 mg/dL (ref 0.44–1.00)
GFR, Estimated: >60 mL/min (ref >60)
Glucose, Bld: 180 mg/dL — ABNORMAL HIGH (ref 70–99)
Potassium: 4.5 mmol/L (ref 3.5–5.1)
Sodium: 135 mmol/L (ref 135–145)

## 2023-07-28 LAB — CBC WITH DIFFERENTIAL/PLATELET
Abs Immature Granulocytes: 0.01 K/uL (ref 0.00–0.07)
Basophils Absolute: 0.1 K/uL (ref 0.0–0.1)
Basophils Relative: 1 %
Eosinophils Absolute: 0.1 K/uL (ref 0.0–0.5)
Eosinophils Relative: 2 %
HCT: 36.6 % (ref 36.0–46.0)
Hemoglobin: 12.3 g/dL (ref 12.0–15.0)
Immature Granulocytes: 0 %
Lymphocytes Relative: 45 %
Lymphs Abs: 2.2 K/uL (ref 0.7–4.0)
MCH: 30.7 pg (ref 26.0–34.0)
MCHC: 33.6 g/dL (ref 30.0–36.0)
MCV: 91.3 fL (ref 80.0–100.0)
Monocytes Absolute: 0.5 K/uL (ref 0.1–1.0)
Monocytes Relative: 9 %
Neutro Abs: 2.1 K/uL (ref 1.7–7.7)
Neutrophils Relative %: 43 %
Platelets: 269 K/uL (ref 150–400)
RBC: 4.01 MIL/uL (ref 3.87–5.11)
RDW: 11.9 % (ref 11.5–15.5)
WBC: 4.9 K/uL (ref 4.0–10.5)
nRBC: 0 % (ref 0.0–0.2)

## 2023-07-28 LAB — HCG, SERUM, QUALITATIVE: Preg, Serum: NEGATIVE

## 2023-07-28 LAB — TROPONIN I (HIGH SENSITIVITY): Troponin I (High Sensitivity): <2 ng/L (ref <18)

## 2023-07-28 MED ORDER — KETOROLAC TROMETHAMINE 30 MG/ML IJ SOLN
30.0000 mg | Freq: Once | INTRAMUSCULAR | Status: AC
  Administered 2023-07-28: 30 mg via INTRAVENOUS
  Filled 2023-07-28: qty 1

## 2023-07-28 MED ORDER — SODIUM CHLORIDE 0.9 % IV BOLUS
500.0000 mL | Freq: Once | INTRAVENOUS | Status: AC
  Administered 2023-07-28: 500 mL via INTRAVENOUS

## 2023-07-28 MED ORDER — ALBUTEROL SULFATE HFA 108 (90 BASE) MCG/ACT IN AERS
2.0000 | INHALATION_SPRAY | Freq: Once | RESPIRATORY_TRACT | Status: AC
  Administered 2023-07-28: 2 via RESPIRATORY_TRACT
  Filled 2023-07-28: qty 6.7

## 2023-07-28 MED ORDER — METOCLOPRAMIDE HCL 5 MG/ML IJ SOLN
10.0000 mg | Freq: Once | INTRAMUSCULAR | Status: AC
  Administered 2023-07-28: 10 mg via INTRAVENOUS
  Filled 2023-07-28: qty 2

## 2023-07-28 NOTE — Discharge Instructions (Signed)
 Please rest and drink plenty of fluids.  Follow-up with your primary care doctor.  Return to the emergency department if any worsening or concerning symptoms

## 2023-07-28 NOTE — ED Triage Notes (Signed)
 Pt sent from UC for evaluation of migraine and chest tightness with SOB that started this am, per pt CP tightness is not as bad as this am.

## 2023-07-28 NOTE — ED Notes (Signed)
 See triage notes. A/o. Color wnl. Non diaphoretic. States tightness is to upper mid chest. Pt does have hx of asthma. No resp distress or shob noted. NAD.

## 2023-07-28 NOTE — ED Provider Notes (Signed)
 [place] EMERGENCY DEPARTMENT AT [facility] Provider Note   CSN: [id number] Arrival date & time: 07/28/23  9071     Patient presents with: Migraine   Amy Nelson is a 38 y.o. female.  She is here with a complaint of upper chest pain tightness that began around 3 AM this morning.  She has never had it before.  She said it feels similar to asthma that she had as a child.  Feels a little bit short of breath with it.  No radiation no nausea or vomiting.  She is a non-smoker and denies any cocaine.  No cardiac history.  She is also complaining of a recurrent headache.  She said this is her migraine and she has them frequently.  Used to follow with a neurologist here but he retired.  Usually just takes Tylenol  or ibuprofen  or sometimes just sleeps to make it go away.  Not associated with any numbness weakness blurry vision double vision   The history is provided by the patient.  Chest Pain Pain location:  Substernal area Pain quality: tightness   Pain radiates to:  Does not radiate Pain severity:  Moderate Onset quality:  Gradual Duration:  6 hours Timing:  Constant Progression:  Unchanged Chronicity:  New Context: at rest   Relieved by:  None tried Worsened by:  Nothing Ineffective treatments:  None tried Associated symptoms: shortness of breath   Associated symptoms: no abdominal pain, no cough, no diaphoresis, no fever, no heartburn, no nausea and no vomiting        Prior to Admission medications   Medication Sig Start Date End Date Taking? Authorizing Provider  levothyroxine  (SYNTHROID ) 150 MCG tablet Take 1 tablet (150 mcg total) by mouth daily before breakfast. 02/25/23   Nida, Gebreselassie W, MD  topiramate (TOPAMAX) 25 MG tablet Take 2 tablets by mouth 2 (two) times daily. 05/14/21   [provider]    Allergies: Patient has no known allergies.    Review of Systems  Constitutional:  Negative for diaphoresis and fever.  Eyes:  Negative for visual  disturbance.  Respiratory:  Positive for shortness of breath. Negative for cough.   Cardiovascular:  Positive for chest pain.  Gastrointestinal:  Negative for abdominal pain, heartburn, nausea and vomiting.    Updated Vital Signs BP (!) 135/98   Pulse 79   Temp 98.3 F (36.8 C) (Oral)   Resp 16   Ht 5' 2 (1.575 m)   Wt 83.9 kg   LMP 06/13/2012   SpO2 98%   BMI 33.84 kg/m   Physical Exam Vitals and nursing note reviewed.  Constitutional:      General: She is not in acute distress.    Appearance: Normal appearance. She is well-developed.  HENT:     Head: Normocephalic and atraumatic.  Eyes:     Conjunctiva/sclera: Conjunctivae normal.  Cardiovascular:     Rate and Rhythm: Normal rate and regular rhythm.     Heart sounds: No murmur heard. Pulmonary:     Effort: Pulmonary effort is normal. No respiratory distress.     Breath sounds: Normal breath sounds. No stridor. No wheezing.  Abdominal:     Palpations: Abdomen is soft.     Tenderness: There is no abdominal tenderness. There is no guarding or rebound.  Musculoskeletal:        General: No tenderness or deformity. Normal range of motion.     Cervical back: Neck supple.  Skin:    General: Skin is  warm and dry.  Neurological:     General: No focal deficit present.     Mental Status: She is alert.     GCS: GCS eye subscore is 4. GCS verbal subscore is 5. GCS motor subscore is 6.     Motor: No weakness.     (all labs ordered are listed, but only abnormal results are displayed) Labs Reviewed  BASIC METABOLIC PANEL WITH GFR - Abnormal; Notable for the following components:      Result Value   Glucose, Bld 180 (*)    Calcium  8.8 (*)    All other components within normal limits  CBC WITH DIFFERENTIAL/PLATELET  HCG, SERUM, QUALITATIVE  TROPONIN I (HIGH SENSITIVITY)    EKG: EKG Interpretation Date/Time:  Tuesday July 28 2023 09:52:16 EDT Ventricular Rate:  76 PR Interval:  157 QRS Duration:  87 QT  Interval:  387 QTC Calculation: 436 R Axis:   29  Text Interpretation: Sinus rhythm Borderline T abnormalities, diffuse leads No significant change since prior 11/20 Confirmed by Towana Sharper [id number]) on 07/28/2023 9:58:04 AM  Radiology: [facility] Chest Port 1 View Result Date: 07/28/2023 CLINICAL DATA:  Chest pain. EXAM: PORTABLE CHEST 1 VIEW COMPARISON:  None Available. FINDINGS: The heart size and mediastinal contours are within normal limits. Both lungs are clear. The visualized skeletal structures are unremarkable. IMPRESSION: No active disease. Electronically Signed   By: Lynwood Landy Raddle M.D.   On: 07/28/2023 11:24     Procedures   Medications Ordered in the ED  albuterol  (VENTOLIN  HFA) 108 (90 Base) MCG/ACT inhaler 2 puff (2 puffs Inhalation Given 07/28/23 1010)  sodium chloride  0.9 % bolus 500 mL (0 mLs Intravenous Stopped 07/28/23 1055)  ketorolac  (TORADOL ) 30 MG/ML injection 30 mg (30 mg Intravenous Given 07/28/23 1010)  metoCLOPramide  (REGLAN ) injection 10 mg (10 mg Intravenous Given 07/28/23 1138)    Clinical Course as of 07/28/23 1645  Tue Jul 28, 2023  1044 Chest x-ray interpreted by me as no acute infiltrate.  Awaiting radiology reading. [MB]  1128 Chest pain improved with inhaler but still has a headache.  Toradol  did not seem to do much. [MB]  1211 Patient feels better after Reglan .  Comfortable plan for discharge. [MB]    Clinical Course User Index [MB] Towana Sharper BROCKS, MD                                 Medical Decision Making Amount and/or Complexity of Data Reviewed Labs: ordered. Radiology: ordered.  Risk Prescription drug management.   This patient complains of headache and chest pain; this involves an extensive number of treatment Options and is a complaint that carries with it a high risk of complications and morbidity. The differential includes ACS, pneumonia, asthma, reflux, migraine  I ordered, reviewed and interpreted labs, which included CBC normal,  chemistries elevated glucose, troponin negative I ordered medication albuterol  inhaler, migraine cocktail and reviewed PMP when indicated. I ordered imaging studies which included chest x-ray and I independently    visualized and interpreted imaging which showed no acute findings Previous records obtained and reviewed in epic including prior endocrinology notes Cardiac monitoring reviewed, sinus rhythm Social determinants considered, no significant barriers Critical Interventions: None  After the interventions stated above, I reevaluated the patient and found patient's chest pain and headache improved Admission and further testing considered, no indications for admission or further workup at this time.  Recommended follow-up with PCP.  Return instructions discussed      Final diagnoses:  Bad headache  Nonspecific chest pain    ED Discharge Orders     None          Towana Ozell BROCKS, MD 07/28/23 [phone]

## 2023-08-26 ENCOUNTER — Ambulatory Visit: Payer: Medicaid Other | Admitting: "Endocrinology

## 2023-08-26 ENCOUNTER — Encounter: Payer: Self-pay | Admitting: "Endocrinology

## 2023-08-26 VITALS — BP 116/84 | HR 72 | Ht 62.0 in | Wt 186.6 lb

## 2023-08-26 DIAGNOSIS — E890 Postprocedural hypothyroidism: Secondary | ICD-10-CM | POA: Diagnosis not present

## 2023-08-26 DIAGNOSIS — C73 Malignant neoplasm of thyroid gland: Secondary | ICD-10-CM

## 2023-08-26 DIAGNOSIS — E119 Type 2 diabetes mellitus without complications: Secondary | ICD-10-CM | POA: Insufficient documentation

## 2023-08-26 DIAGNOSIS — E785 Hyperlipidemia, unspecified: Secondary | ICD-10-CM | POA: Diagnosis not present

## 2023-08-26 MED ORDER — ROSUVASTATIN CALCIUM 5 MG PO TABS: 5.0000 mg | ORAL_TABLET | Freq: Every day | ORAL | 1 refills | Status: AC

## 2023-08-26 NOTE — Progress Notes (Signed)
 08/26/2023, 11:10 AM      Endocrinology follow-up note  Subjective:    Patient ID: Amy Nelson, female    DOB: 1985/07/10, PCP Joelin, Vineetha, NP   Past Medical History:  Diagnosis Date   Asthma    Cancer (HCC)    Thyroid    Diabetes mellitus without complication (HCC)    Gestational    Past Surgical History:  Procedure Laterality Date   BREAST REDUCTION SURGERY     CESAREAN SECTION     THYROIDECTOMY N/A 12/15/2018   Procedure: TOTAL THYROIDECTOMY;  Surgeon: Eletha Boas, MD;  Location: [facility] OR;  Service: General;  Laterality: N/A;   Social History   Socioeconomic History   Marital status: Single    Spouse name: Not on file   Number of children: Not on file   Years of education: Not on file   Highest education level: Not on file  Occupational History   Not on file  Tobacco Use   Smoking status: Never   Smokeless tobacco: Never  Vaping Use   Vaping status: Never Used  Substance and Sexual Activity   Alcohol use: No   Drug use: No   Sexual activity: Yes    Birth control/protection: None  Other Topics Concern   Not on file  Social History Narrative   Not on file   Social Drivers of Health   Financial Resource Strain: Not on file  Food Insecurity: Not on file  Transportation Needs: Not on file  Physical Activity: Not on file  Stress: Not on file  Social Connections: Not on file   Family History  Problem Relation Age of Onset   Diabetes Mother    Hypertension Mother    Hypertension Father    Hyperlipidemia Father    Outpatient Encounter Medications as of 08/26/2023  Medication Sig   rosuvastatin  (CRESTOR ) 5 MG tablet Take 1 tablet (5 mg total) by mouth at bedtime.   levothyroxine  (SYNTHROID ) 150 MCG tablet Take 1 tablet (150 mcg total) by mouth daily before breakfast.   topiramate (TOPAMAX) 25 MG tablet Take 2 tablets by mouth 2 (two) times daily. (Patient not taking: Reported on 08/26/2023)   No  facility-administered encounter medications on file as of 08/26/2023.   ALLERGIES: No Known Allergies  VACCINATION STATUS:  There is no immunization history on file for this patient.  HPI Amy Nelson is 38 y.o. 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  -She underwent thyroidectomy November 2020. -She underwent Thyrogen  stimulated I-131 thyroid  remnant ablation subsequently.   Post therapy whole-body scan was negative for distant metastases, possible thyroid  remnant in the neck. Her subsequent thyroid /neck  ultrasound on March 26, 2020 showed surgical changes consistent with total thyroidectomy without evidence of residual or recurrent disease.   More recently, in July 2024 she had thyroid  instillation whole-body scan with no evidence of disease recurrence.  Her last thyroid /neck ultrasound from February 2025 was also negative for any thyroid  remnant or local recurrence of disease. - She remains on levothyroxine  150 mcg p.o. daily before breakfast with good consistency. She returns with thyroid  function test consistent with appropriate replacement, diagnosed in the interval with  type 2 diabetes.  She also has hyperlipidemia not on treatment.   She denies palpitations, tremors, heat/cold intolerance.  She denies dysphagia, shortness of breath, or voice change.   Her prior history is as follows: She underwent fine-needle aspiration of an nodular goiter on November 04, 2018.  Cytology report confirmed atypia of undetermined significance.  Subsequent molecular studies confirm approximately 50% risk of malignancy.   She was sent for thyroidectomy during her second trimester pregnancy. She underwent total thyroidectomy on December 15, 2018.  Her surgical sample showed follicular neoplasm.  Outside consultation to Dr. Virginia   Curlee of 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    -Thyrogen  stimulated I-131 thyroid  ablation and   Post therapy whole-body scan performed from April 16-May 16, 2018.  She has recovered from her surgery very well, except for surgical site keloid.    She denies any family history of thyroid  malignancy, however her mother has hypothyroidism on thyroid  hormone supplement. She denies exposure to neck radiation.   Review of Systems Limited as above.  Objective:    BP 116/84   Pulse 72   Ht 5' 2 (1.575 m)   Wt 186 lb 9.6 oz (84.6 kg)   LMP 06/13/2012   BMI 34.13 kg/m   Wt Readings from Last 3 Encounters:  08/26/23 186 lb 9.6 oz (84.6 kg)  07/28/23 185 lb (83.9 kg)  02/25/23 188 lb 9.6 oz (85.5 kg)    Physical Exam   Constitutional:  Body mass index is 34.13 kg/m. , not in acute distress, normal state of mind Eyes:  EOMI, no exophthalmos Neck: Supple Thyroid : + Post thyroidectomy scar with some moderate keloid formation.      CMP ( most recent) CMP     Component Value Date/Time   NA 135 07/28/2023 1010   K 4.5 07/28/2023 1010   CL 103 07/28/2023 1010   CO2 22 07/28/2023 1010   GLUCOSE 180 (H) 07/28/2023 1010   BUN 12 07/28/2023 1010   CREATININE 0.63 07/28/2023 1010   CALCIUM  8.8 (L) 07/28/2023 1010   GFRNONAA >60 07/28/2023 1010   GFRAA >60 12/16/2018 0534     Summary of her thyroid  ultrasound from September 10, 2018: Right lobe 5.1 cm x 1.2 cm x 1.3 cm-no nodules. Left lobe 8.5 mm x 5.3 cm x 4.4 cm with 7.7 cm nodule observed to increase in size compared to prior study from February 2018.  Thyroid  fine-needle aspiration cytology: Atypia of undetermined significance.  The smears are cellular with sheets of follicular cells, follicles and colloid.  There are scattered microfollicular aggregates present.  No papillary architecture or Hurthle cell change.  Afirma  50% risk of Malignancy   Total thyroidectomy on December 15, 2018 FINAL MICROSCOPIC DIAGNOSIS: A. THYROID , TOTAL THYROIDECTOMY: - Follicular neoplasm, pending outside consultation - See comment COMMENT: Slides were sent for outside consultation. Outside consultation to Dr. 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    Recent Results (from the past 2160 hours)  Basic metabolic panel     Status: Abnormal   Collection Time: 07/28/23 10:10 AM  Result Value Ref Range   Sodium 135 135 - 145 mmol/L  Potassium 4.5 3.5 - 5.1 mmol/L    Comment: HEMOLYSIS AT THIS LEVEL MAY AFFECT RESULT   Chloride 103 98 - 111 mmol/L   CO2 22 22 - 32 mmol/L   Glucose, Bld 180 (H) 70 - 99 mg/dL    Comment: Glucose reference range applies only to samples taken after fasting for at least 8 hours.   BUN 12 6 - 20 mg/dL   Creatinine, Ser 9.36 0.44 - 1.00 mg/dL   Calcium  8.8 (L) 8.9 - 10.3 mg/dL   GFR, Estimated >39 >39 mL/min    Comment: (NOTE) Calculated using the CKD-EPI Creatinine Equation (2021)    Anion gap 10 5 - 15    Comment: Performed at [facility], [address], [place], [place] [address]  Troponin I (High Sensitivity)     Status: None   Collection Time: 07/28/23 10:10 AM  Result Value Ref Range   Troponin I (High Sensitivity) <2 <18 ng/L    Comment: (NOTE) Elevated high sensitivity troponin I (hsTnI) values and significant  changes across serial measurements may suggest ACS but many other  chronic and acute conditions are known to elevate hsTnI results.  Refer to the Links section for chest pain algorithms and additional  guidance. Performed at [facility], [address], [place], [place] [address]   CBC with Differential     Status: None   Collection Time: 07/28/23 10:10 AM  Result Value  Ref Range   WBC 4.9 4.0 - 10.5 K/uL   RBC 4.01 3.87 - 5.11 MIL/uL   Hemoglobin 12.3 12.0 - 15.0 g/dL   HCT 63.3 63.9 - 53.9 %   MCV 91.3 80.0 - 100.0 fL   MCH 30.7 26.0 - 34.0 pg   MCHC 33.6 30.0 - 36.0 g/dL   RDW 88.0 88.4 - 84.4 %   Platelets 269 150 - 400 K/uL   nRBC 0.0 0.0 - 0.2 %   Neutrophils Relative % 43 %   Neutro Abs 2.1 1.7 - 7.7 K/uL   Lymphocytes Relative 45 %   Lymphs Abs 2.2 0.7 - 4.0 K/uL   Monocytes Relative 9 %   Monocytes Absolute 0.5 0.1 - 1.0 K/uL   Eosinophils Relative 2 %   Eosinophils Absolute 0.1 0.0 - 0.5 K/uL   Basophils Relative 1 %   Basophils Absolute 0.1 0.0 - 0.1 K/uL   Immature Granulocytes 0 %   Abs Immature Granulocytes 0.01 0.00 - 0.07 K/uL    Comment: Performed at [facility], [address], [place], [place] [address]  hCG, serum, qualitative     Status: None   Collection Time: 07/28/23 10:10 AM  Result Value Ref Range   Preg, Serum NEGATIVE NEGATIVE    Comment:        THE SENSITIVITY OF THIS METHODOLOGY IS >10 mIU/mL. Performed at [facility], [address], [place], [place] [address]   TSH     Status: Abnormal   Collection Time: 08/24/23  8:13 AM  Result Value Ref Range   TSH 0.348 (L) 0.450 - 4.500 uIU/mL  T4, free     Status: None   Collection Time: 08/24/23  8:13 AM  Result Value Ref Range   Free T4 1.59 0.82 - 1.77 ng/dL  Thyroglobulin antibody     Status: None   Collection Time: 08/24/23  8:13 AM  Result Value Ref Range   Thyroglobulin Antibody <1.0 0.0 - 0.9 IU/mL    Comment: Thyroglobulin Antibody measured by [employer] Methodology It should be  noted that the presence of thyroglobulin antibodies may not be pathogenic nor diagnostic, especially at very low levels. The assay manufacturer has found that four percent of individuals without evidence of thyroid  disease or autoimmunity will have positive TgAb levels up to 4 IU/mL.   Thyroglobulin Level     Status: None (Preliminary result)   Collection Time: 08/24/23   8:13 AM  Result Value Ref Range   Thyroglobulin (TG-RIA) WILL FOLLOW   Lipid panel     Status: Abnormal   Collection Time: 08/24/23  8:13 AM  Result Value Ref Range   Cholesterol, Total 219 (H) 100 - 199 mg/dL   Triglycerides 769 (H) 0 - 149 mg/dL   HDL 50 >60 mg/dL   VLDL Cholesterol Cal 41 (H) 5 - 40 mg/dL   LDL Chol Calc (NIH) 871 (H) 0 - 99 mg/dL   Chol/HDL Ratio 4.4 0.0 - 4.4 ratio    Comment:                                   T. Chol/HDL Ratio                                             Men  Women                               1/2 Avg.Risk  3.4    3.3                                   Avg.Risk  5.0    4.4                                2X Avg.Risk  9.6    7.1                                3X Avg.Risk 23.4   11.0    Thyroid  insulated I-131 thyroid  remnant ablation between April 16-3/26/2021. FINDINGS: Uptake at the thyroid  bed bilaterally consistent with remnant.  No additional sites of abnormal radio iodine accumulation are seen to suggest iodine-avid metastatic thyroid  cancer.   Expected tracer excretion into urinary bladder.    Thyroid /neck ultrasound March 26, 2020 IMPRESSION: Thyroid  remnant bilaterally.  No scintigraphic evidence of iodine-avid metastatic thyroid  cancer.  FINDINGS: The thyroid  gland is surgically absent. Evaluation of the left and right thyroid  resection beds demonstrates no evidence of residual or recurrent thyroid  tissue. No nodularity or suspicious lymphadenopathy.   IMPRESSION: Surgical changes of total thyroidectomy without evidence of residual or recurrent disease.  Thyrogen  stimulated whole-body scan on August 04, 2022 FINDINGS: No radiotracer activity in thyroid  bed. No radiotracer activity on whole-body scan.   IMPRESSION: No evidence of local recurrence of thyroid  carcinoma thyroid  bed.  No evidence of metastatic disease on whole-body scan.   Thyroid /neck ultrasound March 04, 2023 FINDINGS: Total thyroidectomy changes again  noted. No thyroid  bed abnormality, nodule, mass or residual thyroid  tissue. No regional adenopathy.   IMPRESSION: Stable total thyroidectomy changes. No significant finding by ultrasound.   The above  is in keeping with the ACR TI-RADS recommendations - J Am Coll Radiol 2017;14:587-595.    Assessment & Plan:    1.  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  Nodular goiter -resolved 3.   Postsurgical hypothyroidism 4.  Hyperlipidemia 5.  Type 2 diabetes  -She is status post total thyroidectomy on December 15, 2018, follicular carcinoma confirmed on left lobe.  This was followed by Thyrogen  stimulated radioactive iodine ablation of thyroid  remnants.  She has a series of post- therapy surveillance imaging studies which are negative for tumor recurrence.  Thyroid  and stimulated whole-body scan was negative results from July 2024, neck/thyroid  ultrasound with negative findings in February 2025.  This completes the initial intensive phase of thyroid  cancer.  She was more of intermediate risk initially, subsequently displayed excellent response to treatment.  Her thyroglobulin continues to be undetectable.  She is made aware of the need for continued surveillance studies for the next 5 to 8 years .  She will be considered for surveillance thyroid /neck ultrasound in a year.     For her surgical hypothyroidism: Her pre-visit thyroid  function tests are consistent with appropriate suppressive treatment.  She is advised to continue levothyroxine  150 mcg p.o. daily before breakfast.     - We discussed about the correct intake of her thyroid  hormone, on empty stomach at fasting, with water , separated by at least 30 minutes from breakfast and other medications,  and separated by more than 4 hours from calcium , iron , multivitamins, acid reflux medications (PPIs). -Patient is made aware of the fact that thyroid  hormone replacement is needed for life, dose to be adjusted by periodic monitoring of thyroid   function tests.   hyperlipidemia: In light of her diagnosis type 2 diabetes in the interval, she would benefit from low-dose statin intervention for persistent hyperlipidemia.  I discussed and prescribed Crestor  5 mg p.o. nightly with plan to advance if her response is not satisfactory.    Whole food plant-based diet was discussed and recommended to her.  She verbally reports that her A1c was 7.7% at her PMD. There seems to be a plan ongoing for her to get a GLP-1 receptor agonist via her primary care provider, she is clear for this intervention from thyroid  point of view.  - I advised her  to maintain close follow up with Joelin, Vineetha, NP for primary care needs, as well as her OB/GYN given her pregnancy.   I spent  26  minutes in the care of the patient today including review of labs from Thyroid  Function, CMP, and other relevant labs ; imaging/biopsy records (current and previous including abstractions from other facilities); face-to-face time discussing  her lab results and symptoms, medications doses, her options of short and long term treatment based on the latest standards of care / guidelines;   and documenting the encounter.  Lakethia D Geddes  participated in the discussions, expressed understanding, and voiced agreement with the above plans.  All questions were answered to her satisfaction. she is encouraged to contact clinic should she have any questions or concerns prior to her return visit.   Follow up plan: Return in about 6 months (around 02/26/2024) for Fasting Labs  in AM B4 8.   Ranny Earl, MD [facility] [address] [place], [place] [address] Phone: [phone]  Fax: [phone]     08/26/2023, 11:10 AM  This note was partially dictated with voice recognition software. Similar sounding words can be transcribed inadequately or may  not  be corrected upon review.

## 2023-09-01 LAB — THYROGLOBULIN LEVEL: Thyroglobulin (TG-RIA): <2 ng/mL

## 2023-09-01 LAB — LIPID PANEL
Chol/HDL Ratio: 4.4 ratio (ref 0.0–4.4)
Cholesterol, Total: 219 mg/dL — ABNORMAL HIGH (ref 100–199)
HDL: 50 mg/dL (ref >39)
LDL Chol Calc (NIH): 128 mg/dL — ABNORMAL HIGH (ref 0–99)
Triglycerides: 230 mg/dL — ABNORMAL HIGH (ref 0–149)
VLDL Cholesterol Cal: 41 mg/dL — ABNORMAL HIGH (ref 5–40)

## 2023-09-01 LAB — THYROGLOBULIN ANTIBODY: Thyroglobulin Antibody: <1 [IU]/mL (ref 0.0–0.9)

## 2023-09-01 LAB — T4, FREE: Free T4: 1.59 ng/dL (ref 0.82–1.77)

## 2023-09-01 LAB — TSH: TSH: 0.348 u[IU]/mL — ABNORMAL LOW (ref 0.450–4.500)

## 2023-09-02 ENCOUNTER — Other Ambulatory Visit: Payer: Self-pay | Admitting: Medical Genetics

## 2023-09-08 ENCOUNTER — Other Ambulatory Visit: Payer: Self-pay | Admitting: "Endocrinology

## 2023-09-08 DIAGNOSIS — E890 Postprocedural hypothyroidism: Secondary | ICD-10-CM

## 2023-11-02 ENCOUNTER — Other Ambulatory Visit: Payer: Self-pay | Admitting: Medical Genetics

## 2023-11-02 DIAGNOSIS — Z006 Encounter for examination for normal comparison and control in clinical research program: Secondary | ICD-10-CM

## 2024-03-02 ENCOUNTER — Ambulatory Visit: Admitting: "Endocrinology
